# Patient Record
Sex: Male | Born: 1960 | Hispanic: No | Marital: Married | Smoking: Never smoker
Health system: Southern US, Community
[De-identification: ages and names within clinical notes are randomized; demographics above are authoritative.]

---

## 2009-11-19 ENCOUNTER — Ambulatory Visit: Payer: Self-pay | Admitting: Endocrinology

## 2009-11-19 DIAGNOSIS — G622 Polyneuropathy due to other toxic agents: Secondary | ICD-10-CM

## 2009-11-19 DIAGNOSIS — E109 Type 1 diabetes mellitus without complications: Secondary | ICD-10-CM | POA: Insufficient documentation

## 2009-11-19 DIAGNOSIS — F17200 Nicotine dependence, unspecified, uncomplicated: Secondary | ICD-10-CM

## 2009-11-19 DIAGNOSIS — G619 Inflammatory polyneuropathy, unspecified: Secondary | ICD-10-CM | POA: Insufficient documentation

## 2009-11-19 DIAGNOSIS — N289 Disorder of kidney and ureter, unspecified: Secondary | ICD-10-CM | POA: Insufficient documentation

## 2009-11-19 DIAGNOSIS — E113599 Type 2 diabetes mellitus with proliferative diabetic retinopathy without macular edema, unspecified eye: Secondary | ICD-10-CM | POA: Insufficient documentation

## 2009-11-19 DIAGNOSIS — E11359 Type 2 diabetes mellitus with proliferative diabetic retinopathy without macular edema: Secondary | ICD-10-CM

## 2010-05-12 ENCOUNTER — Encounter: Payer: Self-pay | Admitting: Endocrinology

## 2010-05-17 NOTE — Assessment & Plan Note (Signed)
Summary: NEW ENDO/INSULIN PUMP,LOW BLOOD SUGARS/[facility] ACCESS/CD   Vital Signs:  Patient profile:   50 year old male Height:      70 inches (177.80 cm) Weight:      158.13 pounds (71.88 kg) BMI:     22.77 O2 Sat:      98 % on Room air Temp:     97.9 degrees F (36.61 degrees C) oral Pulse rate:   81 / minute BP sitting:   138 / 86  (left arm) Cuff size:   regular  Vitals Entered By: Brenton Grills MA (November 19, 2009 3:41 PM)  O2 Flow:  Room air CC: New Endo/Insulin Pump/Low CBG's/aj   CC:  New Endo/Insulin Pump/Low CBG's/aj.  History of Present Illness: pt states 24 years h/o dm.  it is complicated by retinopathy, renal dz, and peripheral neuropathy.  he has been on insulin since dx.  he takes novolog via the pump.  no cbg record, but states cbg's are extremely variable (hypoglycemia few x/week, and also frequently over 300).  wife says he does not have good hypoglycemia awareness.  he last had severe hypoglycemia a few weeks ago.    he has had the [employer] ir-2020 insulin pump x 2 years.   he does not know pump settings, and he does not know how to change settings.   pt says his diet and exercise are "fair."  symptomatically, pt states few years of moderate pain at the hands and feet, and associated numbness.   Current Medications (verified): 1)  Gabapentin 300 Mg Caps (Gabapentin) .Marland Kitchen.. 1 Capsule Three Times A Day 2)  Cozaar 50 Mg Tabs (Losartan Potassium) .Marland Kitchen.. 1 Tablet Once Daily 3)  Novolog 100 Unit/ml Soln (Insulin Aspart) .... All Day As Needed 4)  Aspirin 81 Mg Tbec (Aspirin) .Marland Kitchen.. 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 Family History  Diabetes--mother Family History Hypertension  Social History: Married Never Smoked disabled.   Smoking Status:  never Seat Belt Use:  yes  Review of Systems       The patient complains of headaches.         denies weight change, chest pain, sob, n/v, memory loss, depression, easy bruising, and rhinorrhea.  he has leg cramps, excessive nocturnal diaphoresis, erectile dysfunction, and frequent urination.  no change in chronic blurry vision.    Physical Exam  General:  normal appearance.   Head:  head: no deformity eyes: no periorbital swelling, no proptosis external nose and ears are normal mouth: no lesion seen Neck:  Supple without thyroid enlargement or tenderness.  Lungs:  Clear to auscultation bilaterally. Normal respiratory effort.  Heart:  Regular rate and rhythm without murmurs or gallops noted. Normal S1,S2.   Abdomen:  abdomen is soft, nontender.  no hepatosplenomegaly.   not distended.  no hernia  Msk:  muscle bulk and strength are grossly normal.  no obvious joint swelling.  gait is normal and steady Pulses:  dorsalis pedis intact bilat.  no carotid bruit Extremities:  no deformity.  no ulcer on the feet.  feet are of normal color and temp.  no edema mycotic toenails.   Neurologic:  cn 2-12 grossly intact.   readily moves all 4's.   sensation is intact to touch on the  feet, but decreased from normal Skin:  seborrhea on the face Cervical Nodes:  No significant adenopathy.  Psych:  Alert and cooperative; normal mood and affect; normal attention span and concentration.     Impression & Recommendations:  Problem # 1:  IDDM (ICD-250.01) glucagon is refused  Problem # 2:  POLYNEUROPATHY (ICD-357.9) prob due to #1  Problem # 3:  RENAL DISEASE (ICD-593.9) due to #1  Medications Added to Medication List This Visit: 1)  Gabapentin 300 Mg Caps (Gabapentin) .Marland Kitchen.. 1 capsule three times a day 2)  Cozaar 50 Mg Tabs (Losartan potassium) .Marland Kitchen.. 1 tablet once daily 3)   Novolog 100 Unit/ml Soln (Insulin aspart) .... Via pump, total approx 40 units/day 4)  Aspirin 81 Mg Tbec (Aspirin) .Marland Kitchen.. 1 once daily  Other Orders: New Patient Level IV (58527)  Patient Instructions: 1)  good diet and exercise habits significanly improve the control of your diabetes.  please let me know if you wish to be referred to a dietician.  high blood sugar is very risky to your health.  you should see an eye doctor every year. 2)  controlling your blood pressure and cholesterol drastically reduces the damage diabetes does to your body.  this also applies to quitting smoking.  please discuss these with your doctor.  you should take an aspirin every day, unless you have been advised by a doctor not to. 3)  check your blood sugar 4 times a day--before the 3 meals, and at bedtime.  also check if you have symptoms of your blood sugar being too high or too low.  please keep a record of the readings and bring it to your next appointment here.  please call us sooner if you are having low blood sugar episodes. 4)  you should obtain a "medic-alert" membership.  here is a form. 5)  please call with pump settings. 6)  Please schedule a follow-up appointment in 1 month.

## 2010-06-07 ENCOUNTER — Ambulatory Visit: Payer: Self-pay | Admitting: Endocrinology

## 2010-06-14 NOTE — Letter (Signed)
Summary: [facility]  [facility]   Imported By: Sherian Rein 06/08/2010 09:23:37  _____________________________________________________________________  External Attachment:    Type:   Image     Comment:   External Document

## 2017-08-22 ENCOUNTER — Emergency Department (HOSPITAL_COMMUNITY): Payer: Medicaid Other

## 2017-08-22 ENCOUNTER — Emergency Department (HOSPITAL_COMMUNITY)
Admission: EM | Admit: 2017-08-22 | Discharge: 2017-08-22 | Disposition: A | Discharge status: Home / Self Care | Payer: Medicaid Other | Attending: Emergency Medicine | Admitting: Emergency Medicine

## 2017-08-22 DIAGNOSIS — S3991XA Unspecified injury of abdomen, initial encounter: Secondary | ICD-10-CM | POA: Diagnosis not present

## 2017-08-22 DIAGNOSIS — S0990XA Unspecified injury of head, initial encounter: Secondary | ICD-10-CM | POA: Insufficient documentation

## 2017-08-22 DIAGNOSIS — S299XXA Unspecified injury of thorax, initial encounter: Secondary | ICD-10-CM | POA: Insufficient documentation

## 2017-08-22 DIAGNOSIS — Z7982 Long term (current) use of aspirin: Secondary | ICD-10-CM | POA: Insufficient documentation

## 2017-08-22 DIAGNOSIS — Y999 Unspecified external cause status: Secondary | ICD-10-CM | POA: Diagnosis not present

## 2017-08-22 DIAGNOSIS — S0181XA Laceration without foreign body of other part of head, initial encounter: Secondary | ICD-10-CM

## 2017-08-22 DIAGNOSIS — Y929 Unspecified place or not applicable: Secondary | ICD-10-CM | POA: Diagnosis not present

## 2017-08-22 DIAGNOSIS — M795 Residual foreign body in soft tissue: Secondary | ICD-10-CM

## 2017-08-22 DIAGNOSIS — Z79899 Other long term (current) drug therapy: Secondary | ICD-10-CM | POA: Diagnosis not present

## 2017-08-22 DIAGNOSIS — Z794 Long term (current) use of insulin: Secondary | ICD-10-CM | POA: Insufficient documentation

## 2017-08-22 DIAGNOSIS — R410 Disorientation, unspecified: Secondary | ICD-10-CM | POA: Diagnosis present

## 2017-08-22 DIAGNOSIS — Z23 Encounter for immunization: Secondary | ICD-10-CM | POA: Diagnosis not present

## 2017-08-22 DIAGNOSIS — E162 Hypoglycemia, unspecified: Secondary | ICD-10-CM | POA: Diagnosis not present

## 2017-08-22 DIAGNOSIS — Y939 Activity, unspecified: Secondary | ICD-10-CM | POA: Insufficient documentation

## 2017-08-22 LAB — I-STAT CHEM 8, ED
BUN: 30 mg/dL — AB (ref 6–20)
CALCIUM ION: 1.13 mmol/L — AB (ref 1.15–1.40)
CHLORIDE: 99 mmol/L — AB (ref 101–111)
CREATININE: 1.2 mg/dL (ref 0.61–1.24)
Glucose, Bld: 197 mg/dL — ABNORMAL HIGH (ref 65–99)
HCT: 35 % — ABNORMAL LOW (ref 39.0–52.0)
Hemoglobin: 11.9 g/dL — ABNORMAL LOW (ref 13.0–17.0)
Potassium: 3.9 mmol/L (ref 3.5–5.1)
SODIUM: 138 mmol/L (ref 135–145)
TCO2: 28 mmol/L (ref 22–32)

## 2017-08-22 LAB — COMPREHENSIVE METABOLIC PANEL
ALBUMIN: 3.3 g/dL — AB (ref 3.5–5.0)
ALK PHOS: 106 U/L (ref 38–126)
ALT: 25 U/L (ref 17–63)
ANION GAP: 7 (ref 5–15)
AST: 27 U/L (ref 15–41)
BILIRUBIN TOTAL: 1 mg/dL (ref 0.3–1.2)
BUN: 25 mg/dL — AB (ref 6–20)
CALCIUM: 8.6 mg/dL — AB (ref 8.9–10.3)
CO2: 27 mmol/L (ref 22–32)
Chloride: 104 mmol/L (ref 101–111)
Creatinine, Ser: 1.32 mg/dL — ABNORMAL HIGH (ref 0.61–1.24)
GFR calc Af Amer: >60 mL/min (ref >60)
GFR, EST NON AFRICAN AMERICAN: 60 mL/min — AB (ref >60)
GLUCOSE: 202 mg/dL — AB (ref 65–99)
Potassium: 4 mmol/L (ref 3.5–5.1)
Sodium: 138 mmol/L (ref 135–145)
TOTAL PROTEIN: 5.4 g/dL — AB (ref 6.5–8.1)

## 2017-08-22 LAB — SAMPLE TO BLOOD BANK

## 2017-08-22 LAB — CBC
HCT: 36.3 % — ABNORMAL LOW (ref 39.0–52.0)
Hemoglobin: 12.6 g/dL — ABNORMAL LOW (ref 13.0–17.0)
MCH: 29.8 pg (ref 26.0–34.0)
MCHC: 34.7 g/dL (ref 30.0–36.0)
MCV: 85.8 fL (ref 78.0–100.0)
Platelets: 179 10*3/uL (ref 150–400)
RBC: 4.23 MIL/uL (ref 4.22–5.81)
RDW: 12.4 % (ref 11.5–15.5)
WBC: 6 10*3/uL (ref 4.0–10.5)

## 2017-08-22 LAB — PROTIME-INR
INR: 1.05
Prothrombin Time: 13.6 seconds (ref 11.4–15.2)

## 2017-08-22 LAB — CBG MONITORING, ED
GLUCOSE-CAPILLARY: 342 mg/dL — AB (ref 65–99)
Glucose-Capillary: 57 mg/dL — ABNORMAL LOW (ref 65–99)
Glucose-Capillary: 149 mg/dL — ABNORMAL HIGH (ref 65–99)

## 2017-08-22 LAB — ETHANOL

## 2017-08-22 LAB — I-STAT CG4 LACTIC ACID, ED: LACTIC ACID, VENOUS: 1.51 mmol/L (ref 0.5–1.9)

## 2017-08-22 MED ORDER — IOHEXOL 300 MG/ML  SOLN
100.0000 mL | Freq: Once | INTRAMUSCULAR | Status: AC | PRN
  Administered 2017-08-22: 100 mL via INTRAVENOUS

## 2017-08-22 MED ORDER — TETANUS-DIPHTH-ACELL PERTUSSIS 5-2.5-18.5 LF-MCG/0.5 IM SUSP
0.5000 mL | Freq: Once | INTRAMUSCULAR | Status: AC
  Administered 2017-08-22: 0.5 mL via INTRAMUSCULAR
  Filled 2017-08-22: qty 0.5

## 2017-08-22 MED ORDER — DEXTROSE-NACL 5-0.9 % IV SOLN: INTRAVENOUS | Status: DC

## 2017-08-22 MED ORDER — ONDANSETRON HCL 4 MG/2ML IJ SOLN
INTRAMUSCULAR | Status: AC
  Filled 2017-08-22: qty 2

## 2017-08-22 MED ORDER — DEXTROSE 50 % IV SOLN
INTRAVENOUS | Status: AC
  Filled 2017-08-22: qty 50

## 2017-08-22 MED ORDER — DEXTROSE 50 % IV SOLN
INTRAVENOUS | Status: AC | PRN
  Administered 2017-08-22: 1 via INTRAVENOUS

## 2017-08-22 MED ORDER — HYDROCODONE-ACETAMINOPHEN 5-325 MG PO TABS: 1.0000 | ORAL_TABLET | Freq: Four times a day (QID) | ORAL | 0 refills | Status: AC | PRN

## 2017-08-22 MED ORDER — LIDOCAINE-EPINEPHRINE (PF) 2 %-1:200000 IJ SOLN
10.0000 mL | Freq: Once | INTRAMUSCULAR | Status: AC
  Administered 2017-08-22: 10 mL via INTRADERMAL
  Filled 2017-08-22: qty 20

## 2017-08-22 NOTE — ED Notes (Signed)
Report given to Suzy Bouchard
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
## 2017-08-22 NOTE — ED Notes (Signed)
Pt informed we need to a urine specimen; pt verbalized understanding.
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
## 2017-08-22 NOTE — ED Notes (Signed)
PA at bedside to have left arm lac repaired.

## 2017-08-22 NOTE — ED Notes (Signed)
Pt given [place] sandwich, apple sauce, and diet coke.  Family remains at bedside.  Awaiting disposition.

## 2017-08-22 NOTE — ED Notes (Signed)
Pt on phone with friend. 

## 2017-08-22 NOTE — ED Provider Notes (Signed)
2:12 PM Per request of DR. Deretha Emory, I have irrigate the lac on pt's L elbow.  There's a retained shard of glass in the wound.  I believe it was removed during irrigation as I am unable to appreciate it.  Will repeat L elbow xray to confirm.  LACERATION REPAIR Performed by: Fayrene Helper Authorized by: Fayrene Helper Consent: Verbal consent obtained. Risks and benefits: risks, benefits and alternatives were discussed Consent given by: patient Patient identity confirmed: provided demographic data Prepped and Draped in normal sterile fashion Wound explored  Laceration Location: L elbow  Laceration Length: 0.5cm  Foreign Bodies palpated  Anesthesia: local infiltration  Local anesthetic: lidocaine 2% 1 epinephrine  Anesthetic total: 2 ml  Irrigation method: syringe Amount of cleaning: standard  Skin closure: prolene 5.0  Number of sutures: 1  Technique: simple interrupted.  Patient tolerance: Patient tolerated the procedure well with no immediate complications.    Fayrene Helper, PA-C 08/22/17 1613    Vanetta Mulders, MD 08/23/17 [phone]

## 2017-08-22 NOTE — ED Notes (Addendum)
Report from [place], [place].  Pt alert.  Family at bedside. Awaiting x-ray.

## 2017-08-22 NOTE — Discharge Instructions (Signed)
Expect to be sore and stiff the next several days.  Work note provided to be out of work for 5 days.  Rest as much as possible.  Take the hydrocodone as needed.  Follow-up with your primary care doctor.  CT scans of head neck chest abdomen pelvis without any acute findings.

## 2017-08-22 NOTE — ED Notes (Addendum)
Called radiology regarding repeat x-ray of elbow that has not been completed.  Pt requesting something to eat/drink.  Informed him we would have to wait for x-ray results.

## 2017-08-22 NOTE — ED Notes (Signed)
Pt CBG 57, RN notified.
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
## 2017-08-23 LAB — CDS SEROLOGY

## 2020-09-06 ENCOUNTER — Other Ambulatory Visit: Payer: Self-pay

## 2020-09-06 ENCOUNTER — Encounter (HOSPITAL_COMMUNITY): Payer: Self-pay

## 2020-09-06 ENCOUNTER — Inpatient Hospital Stay (HOSPITAL_COMMUNITY)
Admission: EM | Admit: 2020-09-06 | Discharge: 2020-09-09 | DRG: 208 | Disposition: A | Discharge status: Home / Self Care | Payer: Medicaid Other | Attending: Internal Medicine | Admitting: Internal Medicine

## 2020-09-06 ENCOUNTER — Emergency Department (HOSPITAL_COMMUNITY): Payer: Medicaid Other

## 2020-09-06 DIAGNOSIS — E860 Dehydration: Secondary | ICD-10-CM | POA: Diagnosis present

## 2020-09-06 DIAGNOSIS — E785 Hyperlipidemia, unspecified: Secondary | ICD-10-CM | POA: Diagnosis present

## 2020-09-06 DIAGNOSIS — G9341 Metabolic encephalopathy: Secondary | ICD-10-CM | POA: Diagnosis present

## 2020-09-06 DIAGNOSIS — N182 Chronic kidney disease, stage 2 (mild): Secondary | ICD-10-CM | POA: Diagnosis present

## 2020-09-06 DIAGNOSIS — E1011 Type 1 diabetes mellitus with ketoacidosis with coma: Secondary | ICD-10-CM

## 2020-09-06 DIAGNOSIS — N179 Acute kidney failure, unspecified: Secondary | ICD-10-CM | POA: Diagnosis present

## 2020-09-06 DIAGNOSIS — Z7902 Long term (current) use of antithrombotics/antiplatelets: Secondary | ICD-10-CM

## 2020-09-06 DIAGNOSIS — E1022 Type 1 diabetes mellitus with diabetic chronic kidney disease: Secondary | ICD-10-CM | POA: Diagnosis present

## 2020-09-06 DIAGNOSIS — Z8673 Personal history of transient ischemic attack (TIA), and cerebral infarction without residual deficits: Secondary | ICD-10-CM

## 2020-09-06 DIAGNOSIS — K219 Gastro-esophageal reflux disease without esophagitis: Secondary | ICD-10-CM | POA: Diagnosis present

## 2020-09-06 DIAGNOSIS — I129 Hypertensive chronic kidney disease with stage 1 through stage 4 chronic kidney disease, or unspecified chronic kidney disease: Secondary | ICD-10-CM | POA: Diagnosis present

## 2020-09-06 DIAGNOSIS — Z4659 Encounter for fitting and adjustment of other gastrointestinal appliance and device: Secondary | ICD-10-CM

## 2020-09-06 DIAGNOSIS — E1010 Type 1 diabetes mellitus with ketoacidosis without coma: Secondary | ICD-10-CM | POA: Diagnosis present

## 2020-09-06 DIAGNOSIS — J9601 Acute respiratory failure with hypoxia: Secondary | ICD-10-CM | POA: Diagnosis present

## 2020-09-06 DIAGNOSIS — Z79899 Other long term (current) drug therapy: Secondary | ICD-10-CM

## 2020-09-06 DIAGNOSIS — E874 Mixed disorder of acid-base balance: Secondary | ICD-10-CM | POA: Diagnosis present

## 2020-09-06 DIAGNOSIS — Z7982 Long term (current) use of aspirin: Secondary | ICD-10-CM

## 2020-09-06 DIAGNOSIS — R339 Retention of urine, unspecified: Secondary | ICD-10-CM | POA: Diagnosis not present

## 2020-09-06 DIAGNOSIS — U071 COVID-19: Principal | ICD-10-CM | POA: Diagnosis present

## 2020-09-06 DIAGNOSIS — Z794 Long term (current) use of insulin: Secondary | ICD-10-CM | POA: Diagnosis not present

## 2020-09-06 DIAGNOSIS — Z9641 Presence of insulin pump (external) (internal): Secondary | ICD-10-CM | POA: Diagnosis present

## 2020-09-06 DIAGNOSIS — E1110 Type 2 diabetes mellitus with ketoacidosis without coma: Secondary | ICD-10-CM | POA: Diagnosis present

## 2020-09-06 LAB — I-STAT VENOUS BLOOD GAS, ED
Acid-base deficit: 24 mmol/L — ABNORMAL HIGH (ref 0.0–2.0)
Bicarbonate: 6.2 mmol/L — ABNORMAL LOW (ref 20.0–28.0)
Calcium, Ion: 1.02 mmol/L — ABNORMAL LOW (ref 1.15–1.40)
HCT: 36 % — ABNORMAL LOW (ref 39.0–52.0)
Hemoglobin: 12.2 g/dL — ABNORMAL LOW (ref 13.0–17.0)
O2 Saturation: 83 %
Potassium: 7.2 mmol/L (ref 3.5–5.1)
Sodium: 128 mmol/L — ABNORMAL LOW (ref 135–145)
TCO2: 7 mmol/L — ABNORMAL LOW (ref 22–32)
pCO2, Ven: 24.7 mmHg — ABNORMAL LOW (ref 44.0–60.0)
pH, Ven: 7.008 — CL (ref 7.250–7.430)
pO2, Ven: 69 mmHg — ABNORMAL HIGH (ref 32.0–45.0)

## 2020-09-06 LAB — I-STAT ARTERIAL BLOOD GAS, ED
Acid-base deficit: 20 mmol/L — ABNORMAL HIGH (ref 0.0–2.0)
Bicarbonate: 7.8 mmol/L — ABNORMAL LOW (ref 20.0–28.0)
Calcium, Ion: 1.16 mmol/L (ref 1.15–1.40)
HCT: 32 % — ABNORMAL LOW (ref 39.0–52.0)
Hemoglobin: 10.9 g/dL — ABNORMAL LOW (ref 13.0–17.0)
O2 Saturation: 100 %
Patient temperature: 97.6
Potassium: 6.3 mmol/L (ref 3.5–5.1)
Sodium: 128 mmol/L — ABNORMAL LOW (ref 135–145)
TCO2: 9 mmol/L — ABNORMAL LOW (ref 22–32)
pCO2 arterial: 24.5 mmHg — ABNORMAL LOW (ref 32.0–48.0)
pH, Arterial: 7.108 — CL (ref 7.350–7.450)
pO2, Arterial: 387 mmHg — ABNORMAL HIGH (ref 83.0–108.0)

## 2020-09-06 LAB — CBC
HCT: 38.5 % — ABNORMAL LOW (ref 39.0–52.0)
HCT: 40.7 % (ref 39.0–52.0)
Hemoglobin: 12.1 g/dL — ABNORMAL LOW (ref 13.0–17.0)
Hemoglobin: 11.7 g/dL — ABNORMAL LOW (ref 13.0–17.0)
MCH: 30.9 pg (ref 26.0–34.0)
MCH: 30.2 pg (ref 26.0–34.0)
MCHC: 31.4 g/dL (ref 30.0–36.0)
MCHC: 28.7 g/dL — ABNORMAL LOW (ref 30.0–36.0)
MCV: 98.5 fL (ref 80.0–100.0)
MCV: 105.2 fL — ABNORMAL HIGH (ref 80.0–100.0)
Platelets: 241 10*3/uL (ref 150–400)
Platelets: 196 10*3/uL (ref 150–400)
RBC: 3.91 MIL/uL — ABNORMAL LOW (ref 4.22–5.81)
RBC: 3.87 MIL/uL — ABNORMAL LOW (ref 4.22–5.81)
RDW: 12.4 % (ref 11.5–15.5)
RDW: 12.2 % (ref 11.5–15.5)
WBC: 22.5 10*3/uL — ABNORMAL HIGH (ref 4.0–10.5)
WBC: 17.9 10*3/uL — ABNORMAL HIGH (ref 4.0–10.5)
nRBC: 0 % (ref 0.0–0.2)
nRBC: 0 % (ref 0.0–0.2)

## 2020-09-06 LAB — BASIC METABOLIC PANEL
Anion gap: 28 — ABNORMAL HIGH (ref 5–15)
BUN: 67 mg/dL — ABNORMAL HIGH (ref 6–20)
CO2: 7 mmol/L — ABNORMAL LOW (ref 22–32)
Calcium: 7.9 mg/dL — ABNORMAL LOW (ref 8.9–10.3)
Chloride: 92 mmol/L — ABNORMAL LOW (ref 98–111)
Creatinine, Ser: 4.15 mg/dL — ABNORMAL HIGH (ref 0.61–1.24)
GFR, Estimated: 16 mL/min — ABNORMAL LOW (ref >60)
Glucose, Bld: 1218 mg/dL (ref 70–99)
Potassium: >7.5 mmol/L (ref 3.5–5.1)
Sodium: 127 mmol/L — ABNORMAL LOW (ref 135–145)

## 2020-09-06 LAB — CBG MONITORING, ED
Glucose-Capillary: >600 mg/dL (ref 70–99)
Glucose-Capillary: >600 mg/dL (ref 70–99)
Glucose-Capillary: >600 mg/dL (ref 70–99)
Glucose-Capillary: >600 mg/dL (ref 70–99)
Glucose-Capillary: >600 mg/dL (ref 70–99)

## 2020-09-06 LAB — GLUCOSE, CAPILLARY: Glucose-Capillary: >600 mg/dL (ref 70–99)

## 2020-09-06 MED ORDER — ROCURONIUM BROMIDE 50 MG/5ML IV SOLN
INTRAVENOUS | Status: DC | PRN
  Administered 2020-09-06: 90 mg via INTRAVENOUS

## 2020-09-06 MED ORDER — DEXTROSE 50 % IV SOLN
0.0000 mL | INTRAVENOUS | Status: DC | PRN
Start: 2020-09-06 — End: 2020-09-09

## 2020-09-06 MED ORDER — FENTANYL BOLUS VIA INFUSION
50.0000 ug | INTRAVENOUS | Status: DC | PRN
  Administered 2020-09-07: 50 ug via INTRAVENOUS
  Filled 2020-09-06: qty 100

## 2020-09-06 MED ORDER — CALCIUM CHLORIDE 10 % IV SOLN
INTRAVENOUS | Status: DC | PRN
  Administered 2020-09-06: 1 g via INTRAVENOUS

## 2020-09-06 MED ORDER — FENTANYL 2500MCG IN NS 250ML (10MCG/ML) PREMIX INFUSION
50.0000 ug/h | INTRAVENOUS | Status: DC
Start: 2020-09-06 — End: 2020-09-07
  Administered 2020-09-06: 100 ug/h via INTRAVENOUS
  Administered 2020-09-06: 50 ug/h via INTRAVENOUS
  Filled 2020-09-06: qty 250

## 2020-09-06 MED ORDER — DEXTROSE IN LACTATED RINGERS 5 % IV SOLN: INTRAVENOUS | Status: DC

## 2020-09-06 MED ORDER — ETOMIDATE 2 MG/ML IV SOLN
INTRAVENOUS | Status: AC
  Filled 2020-09-06: qty 20

## 2020-09-06 MED ORDER — LACTATED RINGERS IV SOLN: INTRAVENOUS | Status: DC

## 2020-09-06 MED ORDER — LACTATED RINGERS IV BOLUS
2000.0000 mL | Freq: Once | INTRAVENOUS | Status: AC
  Administered 2020-09-06: 2000 mL via INTRAVENOUS

## 2020-09-06 MED ORDER — ROCURONIUM BROMIDE 10 MG/ML (PF) SYRINGE
PREFILLED_SYRINGE | INTRAVENOUS | Status: AC
  Filled 2020-09-06: qty 10

## 2020-09-06 MED ORDER — EPINEPHRINE HCL 5 MG/250ML IV SOLN IN NS
INTRAVENOUS | Status: AC
  Filled 2020-09-06: qty 250

## 2020-09-06 MED ORDER — ETOMIDATE 2 MG/ML IV SOLN
INTRAVENOUS | Status: DC | PRN
  Administered 2020-09-06: 20 mg via INTRAVENOUS

## 2020-09-06 MED ORDER — HEPARIN SODIUM (PORCINE) 5000 UNIT/ML IJ SOLN
5000.0000 [IU] | Freq: Three times a day (TID) | INTRAMUSCULAR | Status: DC
  Administered 2020-09-07 – 2020-09-09 (×7): 5000 [IU] via SUBCUTANEOUS
  Filled 2020-09-06 (×8): qty 1

## 2020-09-06 MED ORDER — EPINEPHRINE 1 MG/10ML IJ SOSY
PREFILLED_SYRINGE | INTRAMUSCULAR | Status: AC
  Filled 2020-09-06: qty 10

## 2020-09-06 MED ORDER — SUCCINYLCHOLINE CHLORIDE 200 MG/10ML IV SOSY
PREFILLED_SYRINGE | INTRAVENOUS | Status: AC
  Filled 2020-09-06: qty 10

## 2020-09-06 MED ORDER — STERILE WATER FOR INJECTION IV SOLN
Freq: Once | INTRAVENOUS | Status: AC
  Filled 2020-09-06: qty 1000

## 2020-09-06 MED ORDER — INSULIN ASPART 100 UNIT/ML IV SOLN
10.0000 [IU] | Freq: Once | INTRAVENOUS | Status: AC
  Administered 2020-09-06: 10 [IU] via INTRAVENOUS

## 2020-09-06 MED ORDER — CALCIUM GLUCONATE-NACL 1-0.675 GM/50ML-% IV SOLN: 1.0000 g | Freq: Once | INTRAVENOUS | Status: DC

## 2020-09-06 MED ORDER — INSULIN REGULAR(HUMAN) IN NACL 100-0.9 UT/100ML-% IV SOLN
INTRAVENOUS | Status: DC
  Administered 2020-09-06: 6.5 [IU]/h via INTRAVENOUS
  Filled 2020-09-06 (×2): qty 100

## 2020-09-06 MED ORDER — SODIUM BICARBONATE 8.4 % IV SOLN
INTRAVENOUS | Status: AC
  Filled 2020-09-06: qty 50

## 2020-09-06 MED ORDER — CALCIUM GLUCONATE-NACL 1-0.675 GM/50ML-% IV SOLN
INTRAVENOUS | Status: AC
  Filled 2020-09-06: qty 50

## 2020-09-06 NOTE — ED Triage Notes (Signed)
BIB EMS for high blood sugar since last night. Pt came with an insulin pump. Initially pt was 83% on room air with EMS. Pt c/o weakness, abdominal pain. Pt was given 1800 IVF.

## 2020-09-06 NOTE — ED Provider Notes (Signed)
I saw and evaluated the patient, reviewed the resident's note and I agree with the findings and plan.  EKG Interpretation  Date/Time:  Monday Sep 06 2020 18:50:58 EDT Ventricular Rate:  102 PR Interval:  169 QRS Duration: 122 QT Interval:  374 QTC Calculation: 488 R Axis:   115 Text Interpretation: Sinus tachycardia IVCD, consider atypical RBBB Probable anteroseptal infarct, old Confirmed by Lorre Nick ([id number]) on 09/06/2020 8:29:32 PM  60 year old male with history of type 1 diabetes presents with altered status.  Patient has evidence of DKA here.  Given copious months of IV fluids.  Will start insulin drip after his electrolytes resolved.  He will require likely ICU admission   Lorre Nick, MD 09/06/20 2004

## 2020-09-06 NOTE — Progress Notes (Signed)
Date and time results received: 09/06/20 2330 (use smartphrase ".now" to insert current time)  Test: K+ Critical Value: >7.5  Name of Provider Notified: Pola Corn  Orders Received? Or Actions Taken?: Actions Taken: Awaiting orders

## 2020-09-06 NOTE — ED Provider Notes (Signed)
[facility] EMERGENCY DEPARTMENT Provider Note   CSN: [id number] Arrival date & time: 09/06/20  1844     History Chief Complaint  Patient presents with  . Hyperglycemia  . Manic Behavior  . Weakness    CHAPMAN MATTEUCCI is a 60 y.o. male.  HPI  Presents with altered mental status.  Significantly altered on initial evaluation, occasionally speaking clear words nonsensically.  Not following commands.  Per nursing report, brought in by EMS for high blood sugar.  Eventually wife came to bedside and reported that his blood sugar went up last night and he started throwing up and threw up a lot.  He was hyperglycemic at home.  She last saw him before work this morning.  1800 cc IV fluids given by EMS.  Level 5 caveat applies.    No past medical history on file.  Patient Active Problem List   Diagnosis Date Noted  . DKA (diabetic ketoacidosis) ([place]) 09/06/2020  . IDDM 11/19/2009  . SMOKELESS TOBACCO ABUSE 11/19/2009  . POLYNEUROPATHY 11/19/2009  . 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 RENAL DISEASE 11/19/2009         No family history on file.  Social History   Tobacco Use  . Smoking status: Never Smoker  . Smokeless tobacco: Never Used  Substance Use Topics  . Alcohol use: Not Currently    Home Medications Prior to Admission medications   Medication Sig Start Date End Date Taking? Authorizing Provider  amLODipine (NORVASC) 10 MG tablet Take 10 mg by mouth daily.    [provider]  aspirin EC 325 MG tablet Take 325 mg by mouth daily.    [provider]  atorvastatin (LIPITOR) 40 MG tablet Take 40 mg by mouth daily. 06/07/20   [provider]  carvedilol (COREG) 6.25 MG tablet Take 6.25 mg by mouth 2 (two) times daily. 07/20/20   [provider]  cloNIDine (CATAPRES) 0.3 MG tablet Take 0.3 mg by mouth 3 (three) times daily.    [provider]  clopidogrel (PLAVIX) 75 MG tablet Take 75 mg by mouth  daily. 08/30/20   [provider]  gabapentin (NEURONTIN) 300 MG capsule Take 300 mg by mouth 3 (three) times daily. 08/30/20   [provider]  HYDROcodone-acetaminophen (NORCO/VICODIN) 5-325 MG tablet Take 1-2 tablets by mouth every 6 (six) hours as needed for moderate pain. 08/22/17   Fredia Sorrow, MD  hydroxypropyl methylcellulose / hypromellose (ISOPTO TEARS / GONIOVISC) 2.5 % ophthalmic solution Place 1 drop into both eyes as needed for dry eyes.    [provider]  hydrOXYzine (ATARAX/VISTARIL) 25 MG tablet Take 25 mg by mouth daily. 08/12/20   [provider]  insulin aspart (NOVOLOG) 100 UNIT/ML injection Inject into the skin See admin instructions. Uses pump    [provider]  Insulin Human (INSULIN PUMP) SOLN Inject into the skin See admin instructions. using Novolog 100 unit/ml  via insulin pump    [provider]  losartan (COZAAR) 100 MG tablet Take 100 mg by mouth daily. 08/30/20   [provider]  neomycin-bacitracin-polymyxin (NEOSPORIN) ointment Apply 1 application topically as needed for wound care.    [provider]  pantoprazole (PROTONIX) 40 MG tablet Take 40 mg by mouth daily. 08/30/20   [provider]  prednisoLONE acetate (PRED FORTE) 1 % ophthalmic suspension Place 1 drop into both eyes 4 (four) times daily. 08/30/20   [provider]  sevelamer (RENAGEL) 800 MG tablet Take by  mouth 3 (three) times daily with meals. Take 2 tablets (1,600) mg with every meal and one tablet (817m) with a snack.    [provider]    Allergies    Patient has no known allergies.  Review of Systems   Review of Systems  Unable to perform ROS: Mental status change    Physical Exam Updated Vital Signs BP 127/63   Pulse 91   Temp (!) 97.5 F (36.4 C) (Oral)   Resp (!) 30   Ht _0  (1.778 m)   Wt 76.2 kg   SpO2 99%   BMI 24.11 kg/m   Physical Exam Vitals and nursing note reviewed.   Constitutional:      General: He is in acute distress.     Appearance: He is well-developed. He is ill-appearing.  HENT:     Head: Normocephalic and atraumatic.     Mouth/Throat:     Mouth: Mucous membranes are dry.     Pharynx: Oropharynx is clear.  Eyes:     Conjunctiva/sclera: Conjunctivae normal.     Pupils: Pupils are equal, round, and reactive to light.  Cardiovascular:     Rate and Rhythm: Normal rate and regular rhythm.     Heart sounds: No murmur heard.   Pulmonary:     Breath sounds: Normal breath sounds.     Comments: Kussmaul respirations Abdominal:     Palpations: Abdomen is soft.     Tenderness: There is no abdominal tenderness.  Musculoskeletal:     Cervical back: Neck supple.     Right lower leg: No edema.     Left lower leg: No edema.  Skin:    General: Skin is warm and dry.  Neurological:     Comments: GCS [id number] initially GCS 3 on repeat exam  Psychiatric:        Behavior: Behavior normal.        Thought Content: Thought content normal.     ED Results / Procedures / Treatments   Labs (all labs ordered are listed, but only abnormal results are displayed) Labs Reviewed  BASIC METABOLIC PANEL - Abnormal; Notable for the following components:      Result Value   Sodium 127 (*)    Potassium >7.5 (*)    Chloride 92 (*)    CO2 7 (*)    Glucose, Bld 1,218 (*)    BUN 67 (*)    Creatinine, Ser 4.15 (*)    Calcium 7.9 (*)    GFR, Estimated 16 (*)    Anion gap 28 (*)    All other components within normal limits  CBC - Abnormal; Notable for the following components:   WBC 22.5 (*)    RBC 3.91 (*)    Hemoglobin 12.1 (*)    HCT 38.5 (*)    All other components within normal limits  BETA-HYDROXYBUTYRIC ACID - Abnormal; Notable for the following components:   Beta-Hydroxybutyric Acid >8.00 (*)    All other components within normal limits  CBC - Abnormal; Notable for the following components:   WBC 17.9 (*)    RBC 3.87 (*)    Hemoglobin 11.7 (*)     MCV 105.2 (*)    MCHC 28.7 (*)    All other components within normal limits  COMPREHENSIVE METABOLIC PANEL - Abnormal; Notable for the following components:   Sodium 129 (*)    Potassium >7.5 (*)    Chloride 94 (*)    CO2 <7 (*)  Glucose, Bld 1,182 (*)    BUN 69 (*)    Creatinine, Ser 4.24 (*)    Calcium 8.3 (*)    Total Protein 4.8 (*)    Albumin 3.0 (*)    Alkaline Phosphatase 143 (*)    Total Bilirubin 1.4 (*)    GFR, Estimated 16 (*)    All other components within normal limits  GLUCOSE, CAPILLARY - Abnormal; Notable for the following components:   Glucose-Capillary >600 (*)    All other components within normal limits  CBG MONITORING, ED - Abnormal; Notable for the following components:   Glucose-Capillary >600 (*)    All other components within normal limits  CBG MONITORING, ED - Abnormal; Notable for the following components:   Glucose-Capillary >600 (*)    All other components within normal limits  CBG MONITORING, ED - Abnormal; Notable for the following components:   Glucose-Capillary >600 (*)    All other components within normal limits  I-STAT VENOUS BLOOD GAS, ED - Abnormal; Notable for the following components:   pH, Ven 7.008 (*)    pCO2, Ven 24.7 (*)    pO2, Ven 69.0 (*)    Bicarbonate 6.2 (*)    TCO2 7 (*)    Acid-base deficit 24.0 (*)    Sodium 128 (*)    Potassium 7.2 (*)    Calcium, Ion 1.02 (*)    HCT 36.0 (*)    Hemoglobin 12.2 (*)    All other components within normal limits  I-STAT ARTERIAL BLOOD GAS, ED - Abnormal; Notable for the following components:   pH, Arterial 7.108 (*)    pCO2 arterial 24.5 (*)    pO2, Arterial 387 (*)    Bicarbonate 7.8 (*)    TCO2 9 (*)    Acid-base deficit 20.0 (*)    Sodium 128 (*)    Potassium 6.3 (*)    HCT 32.0 (*)    Hemoglobin 10.9 (*)    All other components within normal limits  CBG MONITORING, ED - Abnormal; Notable for the following components:   Glucose-Capillary >600 (*)    All other  components within normal limits  CBG MONITORING, ED - Abnormal; Notable for the following components:   Glucose-Capillary >600 (*)    All other components within normal limits  CULTURE, BLOOD (ROUTINE X 2)  CULTURE, BLOOD (ROUTINE X 2)  SARS CORONAVIRUS 2 (TAT 6-24 HRS)  MRSA PCR SCREENING  URINALYSIS, ROUTINE W REFLEX MICROSCOPIC  BETA-HYDROXYBUTYRIC ACID  BLOOD GAS, ARTERIAL  HIV ANTIBODY (ROUTINE TESTING W REFLEX)  BASIC METABOLIC PANEL  BASIC METABOLIC PANEL  BASIC METABOLIC PANEL  BETA-HYDROXYBUTYRIC ACID  BETA-HYDROXYBUTYRIC ACID  HEMOGLOBIN A1C  URINALYSIS, ROUTINE W REFLEX MICROSCOPIC  BETA-HYDROXYBUTYRIC ACID  BASIC METABOLIC PANEL  BETA-HYDROXYBUTYRIC ACID    EKG EKG Interpretation  Date/Time:  Monday Sep 06 2020 18:50:58 EDT Ventricular Rate:  102 PR Interval:  169 QRS Duration: 122 QT Interval:  374 QTC Calculation: 488 R Axis:   115 Text Interpretation: Sinus tachycardia IVCD, consider atypical RBBB Probable anteroseptal infarct, old Confirmed by Lacretia Leigh ([id number]) on 09/06/2020 8:03:45 PM   Radiology DG Chest Port 1 View  Result Date: 09/06/2020 CLINICAL DATA:  Encounter for intubation.  Shortness of breath. EXAM: PORTABLE CHEST 1 VIEW COMPARISON:  Cxr 08/22/17. FINDINGS: Endotracheal tube terminates 3.5 cm above the carina. The heart size and mediastinal contours are within normal limits. Biapical pleural/pulmonary scarring. No focal consolidation. No pulmonary edema. No pleural effusion. No pneumothorax. No acute osseous abnormality.  IMPRESSION: No active disease. Electronically Signed   By: Iven Finn M.D.   On: 09/06/2020 21:43    Procedures Procedure Name: Intubation Date/Time: 09/06/2020 11:59 PM Performed by: Aris Lot, MD Induction Type: Rapid sequence Laryngoscope Size: Mac and 3 Grade View: Grade I Tube size: 7.5 mm Number of attempts: 1 Placement Confirmation: Positive ETCO2,  CO2 detector and Breath sounds checked- equal and  bilateral Secured at: 28 cm Tube secured with: ETT holder Dental Injury: Teeth and Oropharynx as per pre-operative assessment         Medications Ordered in ED Medications  rocuronium bromide 100 MG/10ML SOSY (has no administration in time range)  succinylcholine (ANECTINE) 200 MG/10ML syringe (has no administration in time range)  etomidate (AMIDATE) 2 MG/ML injection (has no administration in time range)  EPINEPHrine (ADRENALIN) 1 MG/10ML injection (has no administration in time range)  EPINEPHrine NaCl 4-0.9 MG/250ML-% premix infusion (has no administration in time range)  fentaNYL 2539mg in NS 2532m(1071mml) infusion-PREMIX (100 mcg/hr Intravenous New Bag/Given 09/06/20 2152)  fentaNYL (SUBLIMAZE) bolus via infusion 50-100 mcg (has no administration in time range)  heparin injection 5,000 Units (has no administration in time range)  insulin regular, human (MYXREDLIN) 100 units/ 100 mL infusion (6.5 Units/hr Intravenous New Bag/Given 09/06/20 2141)  lactated ringers infusion ( Intravenous New Bag/Given 09/06/20 2235)  dextrose 5 % in lactated ringers infusion (has no administration in time range)  dextrose 50 % solution 0-50 mL (has no administration in time range)  calcium gluconate 1 g/ 50 mL sodium chloride IVPB (has no administration in time range)  etomidate (AMIDATE) injection (20 mg Intravenous Given 09/06/20 2034)  rocuronium (ZEMURON) injection (90 mg Intravenous Given 09/06/20 2034)  calcium chloride injection (1 g Intravenous Given 09/06/20 2030)  lactated ringers bolus 2,000 mL (0 mLs Intravenous Stopped 09/06/20 2117)  sodium bicarbonate 1 mEq/mL injection (  Given 09/06/20 2030)  sodium bicarbonate 150 mEq in sterile water 1,150 mL infusion ( Intravenous Stopped 09/06/20 2259)  insulin aspart (novoLOG) injection 10 Units (10 Units Intravenous Given 09/06/20 2036)    ED Course  I have reviewed the triage vital signs and the nursing notes.  Pertinent labs & imaging  results that were available during my care of the patient were reviewed by me and considered in my medical decision making (see chart for details).    MDM Rules/Calculators/A&P                           Patient presents with altered mental status.  Kussmaul breathing, concern for DKA on arrival, insulin pump noted.  Afebrile; differential sepsis was considered although DKA most likely explains the symptoms.  At time of arrival, the patient had 2 IVs established and began to receive rapid fluid infusion into each IV.  Unfortunately, he had clinical deterioration shortly into his stay and had worsening of his neurologic status.  He became bradycardic for short period to about 45 and frankly hypotensive with blood pressures in the 70s and weak femoral pulses.  He received a gram of calcium chloride and an amp of bicarb and his hemodynamics improved.  His mental status was still poor so he was intubated and the ventilator was set at a high rate.  His work-up showed severe hyperglycemia, severe hyperkalemia, severe acidosis to 7.0.  ICU was immediately consulted for assistance with management.  Bicarbonate drip was initiated.  DKA diagnosed per the patient's acidosis, anion gap, and hyperglycemia.  Likely  also has a component of HHS.  EKG reviewed by me; T waves are prominent although not peaked, sinus rhythm, tachycardia. Discussed with wife at bedside.  Critical care physician came to bedside and evaluated.  Care was handed off to critical care team and the patient was dispositioned to the ICU.  I reviewed his EKG, no focal airspace disease, no cardiomegaly, endotracheal tube in good position.   Final Clinical Impression(s) / ED Diagnoses Final diagnoses:  Encounter for orogastric (OG) tube placement    Rx / DC Orders ED Discharge Orders    None       Aris Lot, MD 09/07/20 Dyann Kief    Lacretia Leigh, MD 09/07/20 1757

## 2020-09-06 NOTE — Progress Notes (Signed)
Patient transported from ED Room 22 to 2M15 with no complications noted.

## 2020-09-06 NOTE — Progress Notes (Signed)
Date and time results received: 09/06/20 2330 (use smartphrase ".now" to insert current time)  Test: Glucose Critical Value: 1182  Name of Provider Notified: Pola Corn  Orders Received? Or Actions Taken?: Actions Taken: Awaiting Orders

## 2020-09-06 NOTE — H&P (Addendum)
-  NAME:  Cory Nash, MRN:  [id number], DOB:  Jan 21, 1961, LOS: 0 ADMISSION DATE:  09/06/2020, CONSULTATION DATE: 09/06/20 REFERRING MD: Dr. Freida Busman, ED, CHIEF COMPLAINT:  Hyperglycemia, N/V, agitation  History of Present Illness:  60 year old man with a history of DM1, here with DKA.  History obtained from his wife Angie at the bedside.   Yesterday his blood sugars were running high in the 400s.   He was also vomiting (non bloody), poor po intake.  Somewhat agitated, refusing to go to hospital.  In the ED he developed worsening altered mental status, worsening tachypnea.   Pertinent  Medical History  DM1 HTN  Significant Hospital Events: Including procedures, antibiotic start and stop dates in addition to other pertinent events   .   Interim History / Subjective:    Objective   Blood pressure (!) 90/48, pulse 75, temperature 97.6 F (36.4 C), temperature source Oral, resp. rate (!) 22, height 5\' 10"  (1.778 m), weight 76.2 kg, SpO2 96 %.        Intake/Output Summary (Last 24 hours) at 09/06/2020 2101 Last data filed at 09/06/2020 1851 Gross per 24 hour  Intake 1800 ml  Output --  Net 1800 ml   Filed Weights   09/06/20 1858  Weight: 76.2 kg    Examination: General: NAD, intubated, recently paralyzed for intubation HENT:NCAt, PERRL Lungs: CTAB Cardiovascular: RRR no mgr Abdomen: NT, ND, NBS Extremities: no edema, no erythema Neuro: Sedated, paralyzed for intubation   Labs/imaging that I havepersonally reviewed  (right click and "Reselect all SmartList Selections" daily)  CBC, BMP, VBG   Resolved Hospital Problem list     Assessment & Plan:  DKA:  Unclear initial cause.  Blood and urine cultures pending.  CXR clear.  Insulin pump at baseline (removed in ED) Continue bicarb for now.  Continue LR,  Insulin Gtt per protocol.   Chem checks, CBG per protocol.  Cont mech ventilation for now. Hyperventilate while PH low.  Reduced FIO2 to 30%. Extubate when mental  status and acidemia improves.  Currently on fentanyl for analgesia.  NPO  AMS, acute encephalopathy - likely 2/2 DKA, acidemia.  Consider CT head if does not improve with improvement in metabolic state.   AKI: monitor UOP.   HTN: hold antiHTN meds for now.     Best practice (right click and "Reselect all SmartList Selections" daily)  Diet:  NPO Pain/Anxiety/Delirium protocol (if indicated): Yes (RASS goal -1) VAP protocol (if indicated): Yes DVT prophylaxis: Subcutaneous Heparin GI prophylaxis: N/a, likely will only be intubated briefly Glucose control:  Insulin gtt Central venous access:  N/A Arterial line:  N/A Foley:  N/A Mobility:  bed rest  PT consulted: N/A Last date of multidisciplinary goals of care discussion []  Code Status:  full code Disposition: ICU  Labs   CBC: Recent Labs  Lab 09/06/20 1922  WBC 22.5*  HGB 12.1*  HCT 38.5*  MCV 98.5  PLT 241    Basic Metabolic Panel: Recent Labs  Lab 09/06/20 1922  NA 127*  K >7.5*  CL 92*  CO2 7*  GLUCOSE 1,218*  BUN 67*  CREATININE 4.15*  CALCIUM 7.9*   GFR: Estimated Creatinine Clearance: 20.3 mL/min (A) (by C-G formula based on SCr of 4.15 mg/dL (H)). Recent Labs  Lab 09/06/20 1922  WBC 22.5*    Liver Function Tests: No results for input(s): AST, ALT, ALKPHOS, BILITOT, PROT, ALBUMIN in the last 168 hours. No results for input(s): LIPASE, AMYLASE in the  last 168 hours. No results for input(s): AMMONIA in the last 168 hours.  ABG    Component Value Date/Time   TCO2 28 08/22/2017 0931     Coagulation Profile: No results for input(s): INR, PROTIME in the last 168 hours.  Cardiac Enzymes: No results for input(s): CKTOTAL, CKMB, CKMBINDEX, TROPONINI in the last 168 hours.  HbA1C: No results found for: HGBA1C  CBG: Recent Labs  Lab 09/06/20 1847 09/06/20 2054  GLUCAP >600* >600*    Review of Systems:   Unable to assess  Past Medical History:  He,  has no past medical history on  file.   Surgical History:     Social History:   reports that he has never smoked. He has never used smokeless tobacco. He reports previous alcohol use.   Family History:  His family history is not on file.   Allergies No Known Allergies   Home Medications  Prior to Admission medications   Medication Sig Start Date End Date Taking? Authorizing Provider  amLODipine (NORVASC) 10 MG tablet Take 10 mg by mouth daily.    [provider]  aspirin EC 325 MG tablet Take 325 mg by mouth daily.    [provider]  cloNIDine (CATAPRES) 0.3 MG tablet Take 0.3 mg by mouth 3 (three) times daily.    [provider]  HYDROcodone-acetaminophen (NORCO/VICODIN) 5-325 MG tablet Take 1-2 tablets by mouth every 6 (six) hours as needed for moderate pain. 08/22/17   Vanetta Mulders, MD  hydroxypropyl methylcellulose / hypromellose (ISOPTO TEARS / GONIOVISC) 2.5 % ophthalmic solution Place 1 drop into both eyes as needed for dry eyes.    [provider]  insulin aspart (NOVOLOG) 100 UNIT/ML injection Inject into the skin See admin instructions. Uses pump    [provider]  Insulin Human (INSULIN PUMP) SOLN Inject into the skin See admin instructions. using Novolog 100 unit/ml  via insulin pump    [provider]  labetalol (NORMODYNE) 200 MG tablet Take 200 mg by mouth 2 (two) times daily.    [provider]  neomycin-bacitracin-polymyxin (NEOSPORIN) ointment Apply 1 application topically as needed for wound care.    [provider]  sevelamer (RENAGEL) 800 MG tablet Take by mouth 3 (three) times daily with meals. Take 2 tablets (1,600) mg with every meal and one tablet (800mg ) with a snack.    [provider]     Critical care time: 45 minutes

## 2020-09-07 ENCOUNTER — Inpatient Hospital Stay (HOSPITAL_COMMUNITY): Payer: Medicaid Other

## 2020-09-07 ENCOUNTER — Encounter (HOSPITAL_COMMUNITY): Payer: Self-pay | Admitting: Pulmonary Disease

## 2020-09-07 DIAGNOSIS — E1011 Type 1 diabetes mellitus with ketoacidosis with coma: Secondary | ICD-10-CM | POA: Diagnosis not present

## 2020-09-07 LAB — COMPREHENSIVE METABOLIC PANEL
ALT: 19 U/L (ref 0–44)
AST: 32 U/L (ref 15–41)
Albumin: 3 g/dL — ABNORMAL LOW (ref 3.5–5.0)
Alkaline Phosphatase: 143 U/L — ABNORMAL HIGH (ref 38–126)
Anion gap: UNDETERMINED (ref 5–15)
BUN: 69 mg/dL — ABNORMAL HIGH (ref 6–20)
CO2: <7 mmol/L — ABNORMAL LOW (ref 22–32)
Calcium: 8.3 mg/dL — ABNORMAL LOW (ref 8.9–10.3)
Chloride: 94 mmol/L — ABNORMAL LOW (ref 98–111)
Creatinine, Ser: 4.24 mg/dL — ABNORMAL HIGH (ref 0.61–1.24)
GFR, Estimated: 16 mL/min — ABNORMAL LOW (ref >60)
Glucose, Bld: 1182 mg/dL (ref 70–99)
Potassium: >7.5 mmol/L (ref 3.5–5.1)
Sodium: 129 mmol/L — ABNORMAL LOW (ref 135–145)
Total Bilirubin: 1.4 mg/dL — ABNORMAL HIGH (ref 0.3–1.2)
Total Protein: 4.8 g/dL — ABNORMAL LOW (ref 6.5–8.1)

## 2020-09-07 LAB — GLUCOSE, CAPILLARY
Glucose-Capillary: 133 mg/dL — ABNORMAL HIGH (ref 70–99)
Glucose-Capillary: 230 mg/dL — ABNORMAL HIGH (ref 70–99)
Glucose-Capillary: 239 mg/dL — ABNORMAL HIGH (ref 70–99)
Glucose-Capillary: 253 mg/dL — ABNORMAL HIGH (ref 70–99)
Glucose-Capillary: 254 mg/dL — ABNORMAL HIGH (ref 70–99)
Glucose-Capillary: 311 mg/dL — ABNORMAL HIGH (ref 70–99)
Glucose-Capillary: 338 mg/dL — ABNORMAL HIGH (ref 70–99)
Glucose-Capillary: 406 mg/dL — ABNORMAL HIGH (ref 70–99)
Glucose-Capillary: 437 mg/dL — ABNORMAL HIGH (ref 70–99)
Glucose-Capillary: 565 mg/dL (ref 70–99)
Glucose-Capillary: >600 mg/dL (ref 70–99)
Glucose-Capillary: >600 mg/dL (ref 70–99)
Glucose-Capillary: >600 mg/dL (ref 70–99)
Glucose-Capillary: >600 mg/dL (ref 70–99)
Glucose-Capillary: >600 mg/dL (ref 70–99)
Glucose-Capillary: >600 mg/dL (ref 70–99)
Glucose-Capillary: >600 mg/dL (ref 70–99)
Glucose-Capillary: >600 mg/dL (ref 70–99)
Glucose-Capillary: >600 mg/dL (ref 70–99)
Glucose-Capillary: >600 mg/dL (ref 70–99)

## 2020-09-07 LAB — URINALYSIS, ROUTINE W REFLEX MICROSCOPIC
Bacteria, UA: NONE SEEN
Bilirubin Urine: NEGATIVE
Glucose, UA: >=500 mg/dL — AB
Ketones, ur: 5 mg/dL — AB
Leukocytes,Ua: NEGATIVE
Nitrite: NEGATIVE
Protein, ur: NEGATIVE mg/dL
Specific Gravity, Urine: 1.02 (ref 1.005–1.030)
pH: 5 (ref 5.0–8.0)

## 2020-09-07 LAB — BASIC METABOLIC PANEL
Anion gap: 20 — ABNORMAL HIGH (ref 5–15)
Anion gap: 14 (ref 5–15)
Anion gap: 12 (ref 5–15)
Anion gap: 8 (ref 5–15)
Anion gap: 5 (ref 5–15)
Anion gap: 3 — ABNORMAL LOW (ref 5–15)
BUN: 69 mg/dL — ABNORMAL HIGH (ref 6–20)
BUN: 71 mg/dL — ABNORMAL HIGH (ref 6–20)
BUN: 69 mg/dL — ABNORMAL HIGH (ref 6–20)
BUN: 65 mg/dL — ABNORMAL HIGH (ref 6–20)
BUN: 57 mg/dL — ABNORMAL HIGH (ref 6–20)
BUN: 54 mg/dL — ABNORMAL HIGH (ref 6–20)
CO2: 13 mmol/L — ABNORMAL LOW (ref 22–32)
CO2: 17 mmol/L — ABNORMAL LOW (ref 22–32)
CO2: 22 mmol/L (ref 22–32)
CO2: 23 mmol/L (ref 22–32)
CO2: 27 mmol/L (ref 22–32)
CO2: 26 mmol/L (ref 22–32)
Calcium: 8.1 mg/dL — ABNORMAL LOW (ref 8.9–10.3)
Calcium: 8.5 mg/dL — ABNORMAL LOW (ref 8.9–10.3)
Calcium: 8.5 mg/dL — ABNORMAL LOW (ref 8.9–10.3)
Calcium: 8.3 mg/dL — ABNORMAL LOW (ref 8.9–10.3)
Calcium: 8.3 mg/dL — ABNORMAL LOW (ref 8.9–10.3)
Calcium: 8.2 mg/dL — ABNORMAL LOW (ref 8.9–10.3)
Chloride: 98 mmol/L (ref 98–111)
Chloride: 105 mmol/L (ref 98–111)
Chloride: 106 mmol/L (ref 98–111)
Chloride: 111 mmol/L (ref 98–111)
Chloride: 111 mmol/L (ref 98–111)
Chloride: 113 mmol/L — ABNORMAL HIGH (ref 98–111)
Creatinine, Ser: 4.51 mg/dL — ABNORMAL HIGH (ref 0.61–1.24)
Creatinine, Ser: 4.02 mg/dL — ABNORMAL HIGH (ref 0.61–1.24)
Creatinine, Ser: 3.61 mg/dL — ABNORMAL HIGH (ref 0.61–1.24)
Creatinine, Ser: 3.08 mg/dL — ABNORMAL HIGH (ref 0.61–1.24)
Creatinine, Ser: 2.48 mg/dL — ABNORMAL HIGH (ref 0.61–1.24)
Creatinine, Ser: 2.32 mg/dL — ABNORMAL HIGH (ref 0.61–1.24)
GFR, Estimated: 14 mL/min — ABNORMAL LOW (ref >60)
GFR, Estimated: 17 mL/min — ABNORMAL LOW (ref >60)
GFR, Estimated: 19 mL/min — ABNORMAL LOW (ref >60)
GFR, Estimated: 23 mL/min — ABNORMAL LOW (ref >60)
GFR, Estimated: 30 mL/min — ABNORMAL LOW (ref >60)
GFR, Estimated: 32 mL/min — ABNORMAL LOW (ref >60)
Glucose, Bld: 1017 mg/dL (ref 70–99)
Glucose, Bld: 705 mg/dL (ref 70–99)
Glucose, Bld: 460 mg/dL — ABNORMAL HIGH (ref 70–99)
Glucose, Bld: 264 mg/dL — ABNORMAL HIGH (ref 70–99)
Glucose, Bld: 154 mg/dL — ABNORMAL HIGH (ref 70–99)
Glucose, Bld: 141 mg/dL — ABNORMAL HIGH (ref 70–99)
Potassium: 4.6 mmol/L (ref 3.5–5.1)
Potassium: 4.2 mmol/L (ref 3.5–5.1)
Potassium: 3.8 mmol/L (ref 3.5–5.1)
Potassium: 4.1 mmol/L (ref 3.5–5.1)
Potassium: 4.3 mmol/L (ref 3.5–5.1)
Potassium: 4.2 mmol/L (ref 3.5–5.1)
Sodium: 131 mmol/L — ABNORMAL LOW (ref 135–145)
Sodium: 136 mmol/L (ref 135–145)
Sodium: 140 mmol/L (ref 135–145)
Sodium: 142 mmol/L (ref 135–145)
Sodium: 143 mmol/L (ref 135–145)
Sodium: 142 mmol/L (ref 135–145)

## 2020-09-07 LAB — BETA-HYDROXYBUTYRIC ACID
Beta-Hydroxybutyric Acid: >8 mmol/L — ABNORMAL HIGH (ref 0.05–0.27)
Beta-Hydroxybutyric Acid: >8 mmol/L — ABNORMAL HIGH (ref 0.05–0.27)
Beta-Hydroxybutyric Acid: 6.81 mmol/L — ABNORMAL HIGH (ref 0.05–0.27)
Beta-Hydroxybutyric Acid: 2.7 mmol/L — ABNORMAL HIGH (ref 0.05–0.27)

## 2020-09-07 LAB — BLOOD GAS, ARTERIAL
Acid-base deficit: 4.3 mmol/L — ABNORMAL HIGH (ref 0.0–2.0)
Bicarbonate: 17.8 mmol/L — ABNORMAL LOW (ref 20.0–28.0)
Drawn by: 60087
FIO2: 40
O2 Saturation: 99.6 %
Patient temperature: 36.4
pCO2 arterial: 19.6 mmHg — CL (ref 32.0–48.0)
pH, Arterial: 7.563 — ABNORMAL HIGH (ref 7.350–7.450)
pO2, Arterial: 191 mmHg — ABNORMAL HIGH (ref 83.0–108.0)

## 2020-09-07 LAB — BLOOD GAS, VENOUS
Acid-base deficit: 12 mmol/L — ABNORMAL HIGH (ref 0.0–2.0)
Bicarbonate: 13.3 mmol/L — ABNORMAL LOW (ref 20.0–28.0)
O2 Saturation: 68.1 %
Patient temperature: 37
pCO2, Ven: 28.7 mmHg — ABNORMAL LOW (ref 44.0–60.0)
pH, Ven: 7.289 (ref 7.250–7.430)

## 2020-09-07 LAB — SARS CORONAVIRUS 2 (TAT 6-24 HRS): SARS Coronavirus 2: POSITIVE — AB

## 2020-09-07 LAB — HEMOGLOBIN A1C
Hgb A1c MFr Bld: 8.7 % — ABNORMAL HIGH (ref 4.8–5.6)
Mean Plasma Glucose: 203 mg/dL

## 2020-09-07 LAB — HIV ANTIBODY (ROUTINE TESTING W REFLEX): HIV Screen 4th Generation wRfx: NONREACTIVE

## 2020-09-07 LAB — MRSA PCR SCREENING: MRSA by PCR: NEGATIVE

## 2020-09-07 MED ORDER — PANTOPRAZOLE SODIUM 40 MG PO TBEC
40.0000 mg | DELAYED_RELEASE_TABLET | Freq: Every day | ORAL | Status: DC
  Administered 2020-09-07 – 2020-09-09 (×3): 40 mg via ORAL
  Filled 2020-09-07 (×3): qty 1

## 2020-09-07 MED ORDER — CHLORHEXIDINE GLUCONATE CLOTH 2 % EX PADS
6.0000 | MEDICATED_PAD | Freq: Every day | CUTANEOUS | Status: DC
  Administered 2020-09-09: 6 via TOPICAL

## 2020-09-07 MED ORDER — ASPIRIN 325 MG PO TABS
325.0000 mg | ORAL_TABLET | Freq: Every day | ORAL | Status: DC
  Administered 2020-09-07 – 2020-09-09 (×3): 325 mg via ORAL
  Filled 2020-09-07 (×3): qty 1

## 2020-09-07 MED ORDER — CLOPIDOGREL BISULFATE 75 MG PO TABS: 75.0000 mg | ORAL_TABLET | Freq: Every day | ORAL | Status: DC

## 2020-09-07 MED ORDER — INSULIN DETEMIR 100 UNIT/ML ~~LOC~~ SOLN
18.0000 [IU] | Freq: Two times a day (BID) | SUBCUTANEOUS | Status: DC
  Administered 2020-09-07 – 2020-09-09 (×4): 18 [IU] via SUBCUTANEOUS
  Filled 2020-09-07 (×5): qty 0.18

## 2020-09-07 MED ORDER — ASPIRIN 325 MG PO TABS: 325.0000 mg | ORAL_TABLET | Freq: Every day | ORAL | Status: DC

## 2020-09-07 MED ORDER — CHLORHEXIDINE GLUCONATE 0.12% ORAL RINSE (MEDLINE KIT)
15.0000 mL | Freq: Two times a day (BID) | OROMUCOSAL | Status: DC
  Administered 2020-09-07 – 2020-09-09 (×4): 15 mL via OROMUCOSAL

## 2020-09-07 MED ORDER — ORAL CARE MOUTH RINSE
15.0000 mL | OROMUCOSAL | Status: DC
  Administered 2020-09-07 (×3): 15 mL via OROMUCOSAL

## 2020-09-07 MED ORDER — PREDNISOLONE ACETATE 1 % OP SUSP
1.0000 [drp] | Freq: Four times a day (QID) | OPHTHALMIC | Status: DC
  Administered 2020-09-07 – 2020-09-09 (×8): 1 [drp] via OPHTHALMIC
  Filled 2020-09-07: qty 5

## 2020-09-07 MED ORDER — INSULIN ASPART 100 UNIT/ML IJ SOLN
1.0000 [IU] | INTRAMUSCULAR | Status: DC
  Administered 2020-09-07 – 2020-09-08 (×2): 3 [IU] via SUBCUTANEOUS
  Administered 2020-09-08: 2 [IU] via SUBCUTANEOUS
  Administered 2020-09-08 – 2020-09-09 (×4): 1 [IU] via SUBCUTANEOUS
  Administered 2020-09-09: 2 [IU] via SUBCUTANEOUS
  Administered 2020-09-09: 3 [IU] via SUBCUTANEOUS

## 2020-09-07 MED ORDER — CLOPIDOGREL BISULFATE 75 MG PO TABS
75.0000 mg | ORAL_TABLET | Freq: Every day | ORAL | Status: DC
  Administered 2020-09-07 – 2020-09-09 (×3): 75 mg via ORAL
  Filled 2020-09-07 (×3): qty 1

## 2020-09-07 NOTE — Plan of Care (Signed)
  Problem: Clinical Measurements: Goal: Will remain free from infection Outcome: Progressing Goal: Diagnostic test results will improve Outcome: Progressing Goal: Respiratory complications will improve Outcome: Progressing Goal: Cardiovascular complication will be avoided Outcome: Progressing   Problem: Activity: Goal: Risk for activity intolerance will decrease Outcome: Progressing   Problem: Coping: Goal: Level of anxiety will decrease Outcome: Progressing   Problem: Elimination: Goal: Will not experience complications related to bowel motility Outcome: Progressing Goal: Will not experience complications related to urinary retention Outcome: Progressing   Problem: Pain Managment: Goal: General experience of comfort will improve Outcome: Progressing   Problem: Safety: Goal: Ability to remain free from injury will improve Outcome: Progressing   Problem: Skin Integrity: Goal: Risk for impaired skin integrity will decrease Outcome: Progressing   Problem: Education: Goal: Ability to describe self-care measures that may prevent or decrease complications (Diabetes Survival Skills Education) will improve Outcome: Progressing Goal: Individualized Educational Video(s) Outcome: Progressing   Problem: Coping: Goal: Ability to adjust to condition or change in health will improve Outcome: Progressing   Problem: Fluid Volume: Goal: Ability to maintain a balanced intake and output will improve Outcome: Progressing   Problem: Health Behavior/Discharge Planning: Goal: Ability to identify and utilize available resources and services will improve Outcome: Progressing Goal: Ability to manage health-related needs will improve Outcome: Progressing   Problem: Nutritional: Goal: Progress toward achieving an optimal weight will improve Outcome: Progressing   Problem: Skin Integrity: Goal: Risk for impaired skin integrity will decrease Outcome: Progressing   Problem: Tissue  Perfusion: Goal: Adequacy of tissue perfusion will improve Outcome: Progressing   Problem: Education: Goal: Knowledge of General Education information will improve Description: Including pain rating scale, medication(s)/side effects and non-pharmacologic comfort measures Outcome: Not Progressing   Problem: Health Behavior/Discharge Planning: Goal: Ability to manage health-related needs will improve Outcome: Not Progressing   Problem: Clinical Measurements: Goal: Ability to maintain clinical measurements within normal limits will improve Outcome: Not Progressing   Problem: Nutrition: Goal: Adequate nutrition will be maintained Outcome: Not Progressing   Problem: Metabolic: Goal: Ability to maintain appropriate glucose levels will improve Outcome: Not Progressing   Problem: Nutritional: Goal: Maintenance of adequate nutrition will improve Outcome: Not Progressing

## 2020-09-07 NOTE — Progress Notes (Signed)
eLink Physician-Brief Progress Note Patient Name: MAXAMILLION BANAS DOB: [birth year] MRN: [id number]   Date of Service  09/07/2020  HPI/Events of Note  Last pH at 9:39 PM = 7.1.  eICU Interventions  Plan: 1. Repeat ABG at 1 AM.     Intervention Category Major Interventions: Acid-Base disturbance - evaluation and management;Respiratory failure - evaluation and management  Zoha Spranger Dennard Nip 09/07/2020, 12:05 AM

## 2020-09-07 NOTE — Progress Notes (Signed)
Family with pt

## 2020-09-07 NOTE — Progress Notes (Signed)
Attempts to phone pt wife x 2 by this RN unsuccessful in connection.  This RN dialing number confirmed with wife prior to her leaving, but there is no person or voicemail message to answer.  After multiple rings, the call simply goes to music then a beep.  This RN hesitant to leave pt information given this. This RN attempting to inform pt's spouse of pt's COVID + status and room change.

## 2020-09-07 NOTE — Progress Notes (Signed)
Tried to give report x2, Rn will attempt again. Provided callback [phone].

## 2020-09-07 NOTE — Progress Notes (Signed)
eLink Physician-Brief Progress Note Patient Name: Cory Nash DOB: May 19, 1960 MRN: [id number]   Date of Service  09/07/2020  HPI/Events of Note  Urinary retention - Bladder scan with 1000 mL residual.   eICU Interventions  Plan: 1. I/O Cath PRN.     Intervention Category Major Interventions: Other:  Lenell Antu 09/07/2020, 4:42 AM

## 2020-09-07 NOTE — Progress Notes (Addendum)
[facility] Daily Progress Note  NAME:  Cory Nash, MRN:  [id number], DOB:  Feb 24, 1961, LOS: 1 ADMISSION DATE:  09/06/2020, CONSULTATION DATE: 09/06/20 REFERRING MD: Dr. Zenia Resides, ED, CHIEF COMPLAINT:  Hyperglycemia, N/V, agitation  History of Present Illness:  60 year old man with a history of DM1, here with DKA.  History obtained from his wife Angie at the bedside.   Yesterday his blood sugars were running high in the 400s.   He was also vomiting (non bloody), poor po intake.  Somewhat agitated, refusing to go to hospital.  In the ED he developed worsening altered mental status, worsening tachypnea.   Of note, he was incidentally found to be COVID positive.  Per discussions with his wife, wife and child had COVID 2 weeks prior but pt had tested negative.  He had been asymptomatic prior to this admission.  Pertinent  Medical History  DM1 HTN  Significant Hospital Events: Including procedures, antibiotic start and stop dates in addition to other pertinent events   5/23 > admit, intubated in ED. 5/24 > extubated.  Interim History / Subjective:  CBGs slightly improved but still high. AG improved at 14 but CO2 still low at 17. Awake on vent.  Tolerated PSV 10/5 well with good MV around 13 - 16 with fentanyl infusion turned off. ABG with resp alkalosis + met acidosis.  Objective   Blood pressure 117/66, pulse 79, temperature (!) 97.5 F (36.4 C), temperature source Oral, resp. rate (!) 23, height 5' 10" (1.778 m), weight 75 kg, SpO2 98 %.    Vent Mode: PRVC FiO2 (%):  [40 %-100 %] 40 % Set Rate:  [26 bmp-30 bmp] 30 bmp Vt Set:  [580 mL] 580 mL PEEP:  [5 cmH20] 5 cmH20 Plateau Pressure:  [15 cmH20-16 cmH20] 16 cmH20   Intake/Output Summary (Last 24 hours) at 09/07/2020 0756 Last data filed at 09/07/2020 0533 Gross per 24 hour  Intake 2512.49 ml  Output 750 ml  Net 1762.49 ml   Filed Weights   09/06/20 1858 09/06/20 2345  Weight: 76.2 kg 75 kg     Examination: General: Adult male, resting in bed, in NAD. Neuro: Opens eyes to voice, follows basic commands.  MAE's. HEENT: [place]/AT. Sclerae anicteric. ETT in place. Cardiovascular: RRR, no M/R/G.  Lungs: Respirations even and unlabored.  CTA bilaterally, No W/R/R. Abdomen: BS x 4, soft, NT/ND.  Musculoskeletal: No gross deformities, no edema.  Skin: Intact, warm, no rashes.   Resolved Hospital Problem list     Assessment & Plan:   DKA with hx underlying DM1 - presumed 2/2 asymptomatic COVID infection (incidentally found to be COVID + on admission).  Of note, per notes his home insulin pump was removed in ED. - Continue insulin infusion per DKA protocol. - Frequent BMP's - Hold home agents.  Acute hypoxic respiratory failure with severe acidosis and inability to protect the airway - 2/2 above, s/p intubation in ED.  This AM, ABG and mental status much improved, tolerating PSV wean well with good vent mechanics. - Extubate now. - Bronchial hygiene. - Mobilize once able.  AoCKD - presumed 2/2 above + dehydration.  Slightly oliguric. - Continue fluids. - Monitor I/O's and follow BMP.  Hx HTN, HLD. - Hold home Amlodipine, Atorvastatin, Carvedilol, Clonidine, Losartan.  Hx CVA. - Resume home ASA, Plavix.  Hx GERD. - Resume home PPI.  Extubate this AM.  If remains stable through the morning, will transfer out of ICU this PM and ask  TRH to assume care in AM 5/25 with PCCM off.  Will check back this afternoon for ultimate decision.  Called and updated his wife Angie of above as well as COVID + finding.  She informed me that she and her child were both COVID + 2 weeks prior, but pt never tested positive and had been asymptomatic prior to this admission.   Critical care time: 30 minutes    Montey Hora, [place] [facility] For pager details, please see AMION or use Epic chat  After 1900, please call [place] for cross coverage needs 09/07/2020,  8:16 AM

## 2020-09-07 NOTE — Progress Notes (Signed)
eLink Physician-Brief Progress Note Patient Name: Cory Nash DOB: [birth year] MRN: [id number]   Date of Service  09/07/2020  HPI/Events of Note  Venous blood gas = 7.289/28.7/NA/13.3.  eICU Interventions  Plan: 1. ABG at 7:30 AM.     Intervention Category Major Interventions: Acid-Base disturbance - evaluation and management;Respiratory failure - evaluation and management  Emre Stock Dennard Nip 09/07/2020, 6:24 AM

## 2020-09-07 NOTE — Progress Notes (Signed)
This RN left generic "if you have a loved one at [facility], we are calling to give you an update and need you to return our call at [phone]," message for pt wife as again generic answering machine reached.

## 2020-09-07 NOTE — Procedures (Signed)
Extubation Procedure Note  Patient Details:   Name: Cory Nash DOB: 08/12/1960 MRN: [id number]   Airway Documentation:    Vent end date: 09/07/20 Vent end time: 0757   Evaluation  O2 sats: stable throughout Complications: No apparent complications Patient did tolerate procedure well. Bilateral Breath Sounds: Clear,Diminished   Patient extubated per MD order & placed on 3L [place]. Patient able to speak & cough post extubation.  Jacqulynn Cadet 09/07/2020, 8:10 AM

## 2020-09-07 NOTE — Progress Notes (Signed)
Inpatient Diabetes Program Recommendations  AACE/ADA: New Consensus Statement on Inpatient Glycemic Control (2015)  Target Ranges:  Prepandial:   less than 140 mg/dL      Peak postprandial:   less than 180 mg/dL (1-2 hours)      Critically ill patients:  140 - 180 mg/dL   Lab Results  Component Value Date   GLUCAP 437 (H) 09/07/2020    Review of Glycemic Control  Diabetes history: DM1 Outpatient Diabetes medications: T Slim Insulin pump (see settings below) Current orders for Inpatient glycemic control: IV Insulin  Inpatient Diabetes Program Recommendations:   Patient sees endocrinologist Dr. Yetta Barre for type 1 diabetes management. Last office visit 07/07/20 with settings listed below.  "Diabetes: On T-Slim insulin pump, started early 2018.  In Control IQ Mode and wearing this with a DexCom G6.  Current pump settings are as follows: MN 1.8; 9am 1.35; 3pm 1.85; 10pm 1.7. Total 40.7 units/day.  Insulin:carb ratio at MN to 1u:15g; 9am 1:13g; 5pm 1u:10g; 10pm 1u:10g.  Sensitivity to MN 70; 9am 75; 5pm 70; 10pm 70. Target 110." .   Will follow and assist as needed.   Thank you, Cory Nash. Cory Brazill, RN, MSN, CDE  Diabetes Coordinator Inpatient Glycemic Control Team Team Pager [phone] (8am-5pm) 09/07/2020 8:58 AM

## 2020-09-08 LAB — GLUCOSE, CAPILLARY
Glucose-Capillary: 126 mg/dL — ABNORMAL HIGH (ref 70–99)
Glucose-Capillary: 135 mg/dL — ABNORMAL HIGH (ref 70–99)
Glucose-Capillary: 142 mg/dL — ABNORMAL HIGH (ref 70–99)
Glucose-Capillary: 191 mg/dL — ABNORMAL HIGH (ref 70–99)
Glucose-Capillary: 228 mg/dL — ABNORMAL HIGH (ref 70–99)
Glucose-Capillary: 232 mg/dL — ABNORMAL HIGH (ref 70–99)

## 2020-09-08 LAB — BASIC METABOLIC PANEL
Anion gap: 4 — ABNORMAL LOW (ref 5–15)
BUN: 49 mg/dL — ABNORMAL HIGH (ref 6–20)
CO2: 26 mmol/L (ref 22–32)
Calcium: 8.4 mg/dL — ABNORMAL LOW (ref 8.9–10.3)
Chloride: 113 mmol/L — ABNORMAL HIGH (ref 98–111)
Creatinine, Ser: 2.04 mg/dL — ABNORMAL HIGH (ref 0.61–1.24)
GFR, Estimated: 37 mL/min — ABNORMAL LOW (ref >60)
Glucose, Bld: 135 mg/dL — ABNORMAL HIGH (ref 70–99)
Potassium: 3.9 mmol/L (ref 3.5–5.1)
Sodium: 143 mmol/L (ref 135–145)

## 2020-09-08 LAB — CBC
HCT: 33.2 % — ABNORMAL LOW (ref 39.0–52.0)
Hemoglobin: 11.8 g/dL — ABNORMAL LOW (ref 13.0–17.0)
MCH: 31 pg (ref 26.0–34.0)
MCHC: 35.5 g/dL (ref 30.0–36.0)
MCV: 87.1 fL (ref 80.0–100.0)
Platelets: 125 10*3/uL — ABNORMAL LOW (ref 150–400)
RBC: 3.81 MIL/uL — ABNORMAL LOW (ref 4.22–5.81)
RDW: 12.7 % (ref 11.5–15.5)
WBC: 13.7 10*3/uL — ABNORMAL HIGH (ref 4.0–10.5)
nRBC: 0 % (ref 0.0–0.2)

## 2020-09-08 LAB — MAGNESIUM: Magnesium: 2.2 mg/dL (ref 1.7–2.4)

## 2020-09-08 LAB — PHOSPHORUS: Phosphorus: 2.3 mg/dL — ABNORMAL LOW (ref 2.5–4.6)

## 2020-09-08 MED ORDER — SODIUM CHLORIDE 0.9 % IV SOLN: INTRAVENOUS | Status: DC

## 2020-09-08 NOTE — Plan of Care (Signed)

## 2020-09-08 NOTE — Progress Notes (Signed)
PROGRESS NOTE    Cory Nash  MCN:[id number] DOB: 1960/06/26 DOA: 09/06/2020 PCP: Myrlene Broker, MD    Brief Narrative:  60 year old gentleman with type 1 diabetes on insulin pump who developed vomiting and poor oral intake for the last few days, high blood sugars and confusion so EMS was called and brought to the ER.  Patient was complaining of some URI symptoms for a week.  He thinks his family had COVID 2 weeks ago. 5/23: Confusion agitation and profound metabolic acidosis and encephalopathy and unable to protect airway, he was intubated and mechanical ventilation.  He was found to be DKA with encephalopathy, started on insulin infusion and admitted to the intensive care unit. 5/24: Extubated to room air, anion gap closed, mental status improved then transferred to medical floor. Found to have positive for COVID-19, significantly abnormal renal functions.   Assessment & Plan:   Active Problems:   DKA (diabetic ketoacidosis) ([place])  Diabetic ketoacidosis, type 1 diabetes with uncontrolled blood sugars.  Patient on insulin pump at home. Developed DKA likely exacerbated by Covid 19 infection. Anion gap closed , HbA1c 8.7 Patient was started on long-acting insulin twice a day and short acting insulin.  Blood sugars are fairly stable.  Symptomatically improving.  Continue current doses today.  If his renal functions improved and appetite improves, we will go back on insulin pump on discharge.  Acute hypoxemic respiratory failure due to metabolic acidosis and encephalopathy, acute metabolic encephalopathy: Currently on room air.  Start mobility and continue breathing exercises.  Acute kidney injury with underlying chronic kidney disease stage II: Known baseline creatinine 1.2-1.3.  Presented with creatinine of more than 4 due to severe electrolyte abnormalities and dehydration.  Gradually improving.  Urine output is adequate. We will start patient on isotonic fluid today, monitor  levels.  Monitor urine output.  Electrolytes are adequate.  COVID-19 viral infection: Asymptomatic.  Nausea improved.  No indication for antivirals.  Hypertension and hyperlipidemia: Holding all antihypertensives.  Will allow renal functions to recover before restarting.  History of a stroke: No evidence of new neurological deficits.  On aspirin Plavix.  Statin on hold.   DVT prophylaxis: heparin injection 5,000 Units Start: 09/06/20 2200 SCDs Start: 09/06/20 2125   Code Status: Full code Family Communication: None at the bedside Disposition Plan: Status is: Inpatient  Remains inpatient appropriate because:IV treatments appropriate due to intensity of illness or inability to take PO and Inpatient level of care appropriate due to severity of illness   Dispo: The patient is from: Home              Anticipated d/c is to: Home              Patient currently is not medically stable to d/c.   Difficult to place patient No         Consultants:   Critical care  Procedures:   None  Antimicrobials:   None   Subjective: Patient seen and examined.  No overnight events.  His breakfast was not delivered and he was quite distressed being hungry.  Denies any nausea or vomiting.  Denies any cough or sputum production.  Afebrile.  Objective: Vitals:   09/08/20 0014 09/08/20 0444 09/08/20 0805 09/08/20 1225  BP: 111/67 127/72 134/73 126/71  Pulse: 76 77 71 72  Resp: _0 Temp: 98.5 F (36.9 C) 98.2 F (36.8 C) 98.4 F (36.9 C) 98.1 F (36.7 C)  TempSrc: Oral Oral Oral Oral  SpO2: 100% 100% 97% 97%  Weight:      Height:        Intake/Output Summary (Last 24 hours) at 09/08/2020 1411 Last data filed at 09/08/2020 0810 Gross per 24 hour  Intake 1781.67 ml  Output 1400 ml  Net 381.67 ml   Filed Weights   09/06/20 1858 09/06/20 2345 09/07/20 2100  Weight: 76.2 kg 75 kg 75.2 kg    Examination:  General exam: Appears calm and comfortable  Mostly on room air.   Without any distress.  Slightly anxious. Respiratory system: Clear to auscultation. Respiratory effort normal. Cardiovascular system: S1 & S2 heard, RRR. Gastrointestinal system: Abdomen is nondistended, soft and nontender. No organomegaly or masses felt. Normal bowel sounds heard. Central nervous system: Alert and oriented.  No obvious focal deficits. Extremities: Symmetric 5 x 5 power.    Data Reviewed: I have personally reviewed following labs and imaging studies  CBC: Recent Labs  Lab 09/06/20 1922 09/06/20 2004 09/06/20 2058 09/06/20 2139 09/08/20 0126  WBC 22.5* 17.9*  --   --  13.7*  HGB 12.1* 11.7* 12.2* 10.9* 11.8*  HCT 38.5* 40.7 36.0* 32.0* 33.2*  MCV 98.5 105.2*  --   --  87.1  PLT 241 196  --   --  654*   Basic Metabolic Panel: Recent Labs  Lab 09/07/20 0911 09/07/20 1338 09/07/20 2016 09/07/20 2213 09/08/20 0126  NA 140 142 143 142 143  K 3.8 4.1 4.3 4.2 3.9  CL 106 111 111 113* 113*  CO2 _0 GLUCOSE 460* 264* 154* 141* 135*  BUN 69* 65* 57* 54* 49*  CREATININE 3.61* 3.08* 2.48* 2.32* 2.04*  CALCIUM 8.5* 8.3* 8.3* 8.2* 8.4*  MG  --   --   --   --  2.2  PHOS  --   --   --   --  2.3*   GFR: Estimated Creatinine Clearance: 42.5 mL/min (A) (by C-G formula based on SCr of 2.04 mg/dL (H)). Liver Function Tests: Recent Labs  Lab 09/06/20 2004  AST 32  ALT 19  ALKPHOS 143*  BILITOT 1.4*  PROT 4.8*  ALBUMIN 3.0*   No results for input(s): LIPASE, AMYLASE in the last 168 hours. No results for input(s): AMMONIA in the last 168 hours. Coagulation Profile: No results for input(s): INR, PROTIME in the last 168 hours. Cardiac Enzymes: No results for input(s): CKTOTAL, CKMB, CKMBINDEX, TROPONINI in the last 168 hours. BNP (last 3 results) No results for input(s): PROBNP in the last 8760 hours. HbA1C: Recent Labs    09/06/20 2004  HGBA1C 8.7*   CBG: Recent Labs  Lab 09/07/20 1933 09/08/20 0017 09/08/20 0454 09/08/20 0817  09/08/20 1228  GLUCAP 133* 142* 126* 135* 228*   Lipid Profile: No results for input(s): CHOL, HDL, LDLCALC, TRIG, CHOLHDL, LDLDIRECT in the last 72 hours. Thyroid Function Tests: No results for input(s): TSH, T4TOTAL, FREET4, T3FREE, THYROIDAB in the last 72 hours. Anemia Panel: No results for input(s): VITAMINB12, FOLATE, FERRITIN, TIBC, IRON, RETICCTPCT in the last 72 hours. Sepsis Labs: No results for input(s): PROCALCITON, LATICACIDVEN in the last 168 hours.  Recent Results (from the past 240 hour(s))  SARS CORONAVIRUS 2 (TAT 6-24 HRS) Nasopharyngeal Nasopharyngeal Swab     Status: Abnormal   Collection Time: 09/06/20  9:45 PM   Specimen: Nasopharyngeal Swab  Result Value Ref Range Status   SARS Coronavirus 2 POSITIVE (A) NEGATIVE Final    Comment: (NOTE) SARS-CoV-2 target nucleic acids are  DETECTED.  The SARS-CoV-2 RNA is generally detectable in upper and lower respiratory specimens during the acute phase of infection. Positive results are indicative of the presence of SARS-CoV-2 RNA. Clinical correlation with patient history and other diagnostic information is  necessary to determine patient infection status. Positive results do not rule out bacterial infection or co-infection with other viruses.  The expected result is Negative.  Fact Sheet for Patients: SugarRoll.be  Fact Sheet for Healthcare Providers: https://www.woods-mathews.com/  This test is not yet approved or cleared by the [place] FDA and  has been authorized for detection and/or diagnosis of SARS-CoV-2 by FDA under an Emergency Use Authorization (EUA). This EUA will remain  in effect (meaning this test can be used) for the duration of the COVID-19 declaration under Section 564(b)(1) of the Act, 21 U. S.C. section 360bbb-3(b)(1), unless the authorization is terminated or revoked sooner.   Performed at [facility], [place] [address] [place],  [place] [address]   Culture, blood (routine x 2)     Status: None (Preliminary result)   Collection Time: 09/06/20  9:51 PM   Specimen: BLOOD  Result Value Ref Range Status   Specimen Description BLOOD LEFT UPPER ARM  Final   Special Requests   Final    BOTTLES DRAWN AEROBIC AND ANAEROBIC Blood Culture adequate volume   Culture   Final    NO GROWTH 2 DAYS Performed at [facility], [place] [address] [place], [place] [address]    Report Status PENDING  Incomplete  Culture, blood (routine x 2)     Status: None (Preliminary result)   Collection Time: 09/06/20 10:15 PM   Specimen: BLOOD RIGHT HAND  Result Value Ref Range Status   Specimen Description BLOOD RIGHT HAND  Final   Special Requests   Final    BOTTLES DRAWN AEROBIC AND ANAEROBIC Blood Culture results may not be optimal due to an inadequate volume of blood received in culture bottles   Culture   Final    NO GROWTH 2 DAYS Performed at [facility], [place] [address], [place],  [address]    Report Status PENDING  Incomplete  MRSA PCR Screening     Status: None   Collection Time: 09/06/20 11:49 PM   Specimen: Nasal Mucosa; Nasopharyngeal  Result Value Ref Range Status   MRSA by PCR NEGATIVE NEGATIVE Final    Comment:        The GeneXpert MRSA Assay (FDA approved for NASAL specimens only), is one component of a comprehensive MRSA colonization surveillance program. It is not intended to diagnose MRSA infection nor to guide or monitor treatment for MRSA infections. Performed at [facility], [place] [address], [place],  [address]          Radiology Studies: DG Chest Port 1 View  Result Date: 09/06/2020 CLINICAL DATA:  Encounter for intubation.  Shortness of breath. EXAM: PORTABLE CHEST 1 VIEW COMPARISON:  Cxr 08/22/17. FINDINGS: Endotracheal tube terminates 3.5 cm above the carina. The heart size and mediastinal contours are within normal limits. Biapical pleural/pulmonary scarring. No focal  consolidation. No pulmonary edema. No pleural effusion. No pneumothorax. No acute osseous abnormality. IMPRESSION: No active disease. Electronically Signed   By: Iven Finn M.D.   On: 09/06/2020 21:43   DG Abd Portable 1V  Result Date: 09/07/2020 CLINICAL DATA:  Orogastric tube placed EXAM: PORTABLE ABDOMEN - 1 VIEW COMPARISON:  None. FINDINGS: Nasogastric tube tip noted overlying the a mid body  of the stomach. Normal abdominal gas pattern. No gross free intraperitoneal gas. IMPRESSION: Nasogastric tube tip within the mid body of the stomach. Electronically Signed   By: Fidela Salisbury MD   On: 09/07/2020 00:51        Scheduled Meds: . aspirin  325 mg Oral Daily  . chlorhexidine gluconate (MEDLINE KIT)  15 mL Mouth Rinse BID  . Chlorhexidine Gluconate Cloth  6 each Topical Daily  . clopidogrel  75 mg Oral Daily  . heparin  5,000 Units Subcutaneous Q8H  . insulin aspart  1-3 Units Subcutaneous Q4H  . insulin detemir  18 Units Subcutaneous Q12H  . pantoprazole  40 mg Oral Q1200  . prednisoLONE acetate  1 drop Both Eyes QID   Continuous Infusions: . sodium chloride 100 mL/hr at 09/08/20 1244  . calcium gluconate       LOS: 2 days    Time spent: 35 minutes    Barb Merino, MD [facility] Pager [phone]

## 2020-09-09 LAB — CBC WITH DIFFERENTIAL/PLATELET
Abs Immature Granulocytes: 0.03 10*3/uL (ref 0.00–0.07)
Basophils Absolute: 0 10*3/uL (ref 0.0–0.1)
Basophils Relative: 0 %
Eosinophils Absolute: 0.1 10*3/uL (ref 0.0–0.5)
Eosinophils Relative: 1 %
HCT: 31.4 % — ABNORMAL LOW (ref 39.0–52.0)
Hemoglobin: 11 g/dL — ABNORMAL LOW (ref 13.0–17.0)
Immature Granulocytes: 1 %
Lymphocytes Relative: 25 %
Lymphs Abs: 1.4 10*3/uL (ref 0.7–4.0)
MCH: 31.3 pg (ref 26.0–34.0)
MCHC: 35 g/dL (ref 30.0–36.0)
MCV: 89.2 fL (ref 80.0–100.0)
Monocytes Absolute: 0.5 10*3/uL (ref 0.1–1.0)
Monocytes Relative: 9 %
Neutro Abs: 3.6 10*3/uL (ref 1.7–7.7)
Neutrophils Relative %: 64 %
Platelets: 92 10*3/uL — ABNORMAL LOW (ref 150–400)
RBC: 3.52 MIL/uL — ABNORMAL LOW (ref 4.22–5.81)
RDW: 12.5 % (ref 11.5–15.5)
WBC: 5.6 10*3/uL (ref 4.0–10.5)
nRBC: 0 % (ref 0.0–0.2)

## 2020-09-09 LAB — BASIC METABOLIC PANEL
Anion gap: 5 (ref 5–15)
BUN: 26 mg/dL — ABNORMAL HIGH (ref 6–20)
CO2: 25 mmol/L (ref 22–32)
Calcium: 8 mg/dL — ABNORMAL LOW (ref 8.9–10.3)
Chloride: 108 mmol/L (ref 98–111)
Creatinine, Ser: 1.2 mg/dL (ref 0.61–1.24)
GFR, Estimated: >60 mL/min (ref >60)
Glucose, Bld: 214 mg/dL — ABNORMAL HIGH (ref 70–99)
Potassium: 3.8 mmol/L (ref 3.5–5.1)
Sodium: 138 mmol/L (ref 135–145)

## 2020-09-09 LAB — GLUCOSE, CAPILLARY
Glucose-Capillary: 114 mg/dL — ABNORMAL HIGH (ref 70–99)
Glucose-Capillary: 153 mg/dL — ABNORMAL HIGH (ref 70–99)
Glucose-Capillary: 165 mg/dL — ABNORMAL HIGH (ref 70–99)
Glucose-Capillary: 211 mg/dL — ABNORMAL HIGH (ref 70–99)

## 2020-09-09 LAB — MAGNESIUM: Magnesium: 2.2 mg/dL (ref 1.7–2.4)

## 2020-09-09 LAB — PHOSPHORUS: Phosphorus: 1.8 mg/dL — ABNORMAL LOW (ref 2.5–4.6)

## 2020-09-09 MED ORDER — POTASSIUM PHOSPHATES 15 MMOLE/5ML IV SOLN
20.0000 mmol | Freq: Once | INTRAVENOUS | Status: AC
  Administered 2020-09-09: 20 mmol via INTRAVENOUS
  Filled 2020-09-09: qty 6.67

## 2020-09-09 NOTE — Discharge Summary (Addendum)
Physician Discharge Summary  Cory Nash DOB: 08/27/1960 DOA: 09/06/2020  PCP: Hadley Penobbins, Robert A, MD  Admit date: 09/06/2020 Discharge date: 09/09/2020  Admitted From: Home Disposition: Home  Recommendations for Outpatient Follow-up:  1. Follow up with PCP in 1 week 2. Please obtain BMP/CBC in one week 3. Some of the blood pressure medications have not been resumed, will have follow-up at the doctor's office.  Home Health: Not applicable Equipment/Devices: Not applicable  Discharge Condition: Stable CODE STATUS: Full code Diet recommendation: Low-salt, low-carb diet.  Plenty of fluid.  Discharge summary: 60 year old gentleman with type 1 diabetes on insulin pump who developed vomiting and poor oral intake for the last few days, high blood sugars and confusion so EMS was called and brought to the ER.  Patient was complaining of some URI symptoms for a week. His household suffered from COVID-19 infection 2 weeks ago.  5/23: Confusion agitation and profound metabolic acidosis and encephalopathy and unable to protect airway, he was intubated and mechanical ventilation.  He was found to be DKA with encephalopathy, started on insulin infusion and admitted to the intensive care unit.  5/24: Extubated to room air, anion gap closed, mental status improved then transferred to medical floor. Found to have positive for COVID-19, significantly abnormal renal functions.  # Diabetic ketoacidosis, type 1 diabetes with uncontrolled blood sugars.  Patient on insulin pump at home. Developed DKA likely exacerbated by Covid 19 infection. Anion gap closed , HbA1c 8.7 Patient was started on long-acting insulin twice a day and short acting insulin.  Blood sugars are fairly stable. Symptomatically improving.  Renal functions normalized.  He will go home and resume his insulin doses through the insulin pump.  # Acute hypoxemic respiratory failure due to metabolic acidosis and encephalopathy,  acute metabolic encephalopathy: Currently on room air.    Adequately improved.  # Acute kidney injury with underlying chronic kidney disease stage II: Known baseline creatinine 1.2-1.3.  Presented with creatinine of more than 4 due to severe electrolyte abnormalities and dehydration.  Normalized to his baseline. Able to keep up with oral hydration. Hold losartan and clonidine until follow-up and repeat renal function.  Will resume amlodipine and carvedilol. Potassium and phosphorus replaced before discharge.  # COVID-19 viral infection: Asymptomatic.  Nausea improved.  No indication for antivirals.  # Hypertension: Blood pressure is stabilizing now.  On multiple antihypertensives.  Resume amlodipine and carvedilol today.  Blood pressure 135/85.  Patient is on clonidine 0.3 mg 3 times a day, no evidence of withdrawal.  We will continue to hold.  Patient on losartan, will continue to hold.  At this time he does not need all 4 antihypertensives.  He may need some time to equilibrate.  Will request follow-up at Dr. Sherral Hammersobbins office within 1 week to recheck his blood pressure and renal function test and go back on clonidine and losartan if needed.   #History of stroke: Patient on Lipitor that he will resume.  Patient on dual antiplatelet therapy with aspirin 325 mg and Plavix 75 mg daily.  He is on a higher dose of aspirin when it is given in combination to Plavix, however maintained on this therapy so I will not change it.   Adequately stabilized to go home.  Discharge Diagnoses:  Active Problems:   DKA (diabetic ketoacidosis) [facility](HCC)    Discharge Instructions  Discharge Instructions    Call MD for:  difficulty breathing, headache or visual disturbances   Complete by: As directed    Call  MD for:  persistant dizziness or light-headedness   Complete by: As directed    Call MD for:  persistant nausea and vomiting   Complete by: As directed    Call MD for:  temperature >100.4   Complete by:  As directed    Diet - low sodium heart healthy   Complete by: As directed    Diet Carb Modified   Complete by: As directed    Discharge instructions   Complete by: As directed    Resume your insulin doses when you go home. Drink plenty of water.  Keep yourself hydrated. Isolation precautions for total 10 days from initial diagnosis until 6/2. To appear blood pressure medications are held and discontinued, you will follow-up at primary care physician's office and resume clonidine and losartan if blood pressure is elevated and kidney tests remain normal.   Increase activity slowly   Complete by: As directed      Allergies as of 09/09/2020   No Known Allergies     Medication List    STOP taking these medications   cloNIDine 0.3 MG tablet Commonly known as: CATAPRES   losartan 100 MG tablet Commonly known as: COZAAR   sevelamer 800 MG tablet Commonly known as: RENAGEL     TAKE these medications   amLODipine 10 MG tablet Commonly known as: NORVASC Take 10 mg by mouth daily.   aspirin EC 325 MG tablet Take 325 mg by mouth daily.   atorvastatin 40 MG tablet Commonly known as: LIPITOR Take 40 mg by mouth daily.   carvedilol 6.25 MG tablet Commonly known as: COREG Take 6.25 mg by mouth 2 (two) times daily.   clopidogrel 75 MG tablet Commonly known as: PLAVIX Take 75 mg by mouth daily.   gabapentin 300 MG capsule Commonly known as: NEURONTIN Take 300 mg by mouth 3 (three) times daily.   HYDROcodone-acetaminophen 5-325 MG tablet Commonly known as: NORCO/VICODIN Take 1-2 tablets by mouth every 6 (six) hours as needed for moderate pain.   hydroxypropyl methylcellulose / hypromellose 2.5 % ophthalmic solution Commonly known as: ISOPTO TEARS / GONIOVISC Place 1 drop into both eyes as needed for dry eyes.   hydrOXYzine 25 MG tablet Commonly known as: ATARAX/VISTARIL Take 25 mg by mouth daily.   insulin aspart 100 UNIT/ML injection Commonly known as:  novoLOG Inject into the skin See admin instructions. Uses pump   insulin pump Soln Inject into the skin See admin instructions. using Novolog 100 unit/ml  via insulin pump   neomycin-bacitracin-polymyxin ointment Commonly known as: NEOSPORIN Apply 1 application topically as needed for wound care.   pantoprazole 40 MG tablet Commonly known as: PROTONIX Take 40 mg by mouth daily.   prednisoLONE acetate 1 % ophthalmic suspension Commonly known as: PRED FORTE Place 1 drop into both eyes 4 (four) times daily.       Follow-up Information    Hadley Pen, MD Follow up in 2 week(s).   Specialty: Family Medicine Contact information: [address] [place] [address] [phone]              No Known Allergies  Consultations:  Critical care   Procedures/Studies: [facility] Chest Port 1 View  Result Date: 09/06/2020 CLINICAL DATA:  Encounter for intubation.  Shortness of breath. EXAM: PORTABLE CHEST 1 VIEW COMPARISON:  Cxr 08/22/17. FINDINGS: Endotracheal tube terminates 3.5 cm above the carina. The heart size and mediastinal contours are within normal limits. Biapical pleural/pulmonary scarring. No focal consolidation. No  pulmonary edema. No pleural effusion. No pneumothorax. No acute osseous abnormality. IMPRESSION: No active disease. Electronically Signed   By: Tish Frederickson M.D.   On: 09/06/2020 21:43   DG Abd Portable 1V  Result Date: 09/07/2020 CLINICAL DATA:  Orogastric tube placed EXAM: PORTABLE ABDOMEN - 1 VIEW COMPARISON:  None. FINDINGS: Nasogastric tube tip noted overlying the a mid body of the stomach. Normal abdominal gas pattern. No gross free intraperitoneal gas. IMPRESSION: Nasogastric tube tip within the mid body of the stomach. Electronically Signed   By: Helyn Numbers MD   On: 09/07/2020 00:51   (Echo, Carotid, EGD, Colonoscopy, ERCP)    Subjective: Patient seen and examined.  No overnight events.  Denies any nausea vomiting shortness of  breath.  Eager to go home.  Eating and drinking well.   Discharge Exam: Vitals:   09/09/20 0400 09/09/20 1220  BP: 123/71 (!) 153/87  Pulse: (!) 59 61  Resp: 16 15  Temp: 98 F (36.7 C) 98.6 F (37 C)  SpO2: 97% 98%   Vitals:   09/08/20 1631 09/08/20 2000 09/09/20 0400 09/09/20 1220  BP: 139/76 (!) 142/73 123/71 (!) 153/87  Pulse: 67 70 (!) 59 61  Resp: Temp: 98.7 F (37.1 C) 98.5 F (36.9 C) 98 F (36.7 C) 98.6 F (37 C)  TempSrc: Oral Oral Oral Axillary  SpO2: 97% 95% 97% 98%  Weight:      Height:        General: Pt is alert, awake, not in acute distress Cardiovascular: RRR, S1/S2 +, no rubs, no gallops Respiratory: CTA bilaterally, no wheezing, no rhonchi Abdominal: Soft, NT, ND, bowel sounds + Extremities: no edema, no cyanosis    The results of significant diagnostics from this hospitalization (including imaging, microbiology, ancillary and laboratory) are listed below for reference.     Microbiology: Recent Results (from the past 240 hour(s))  SARS CORONAVIRUS 2 (TAT 6-24 HRS) Nasopharyngeal Nasopharyngeal Swab     Status: Abnormal   Collection Time: 09/06/20  9:45 PM   Specimen: Nasopharyngeal Swab  Result Value Ref Range Status   SARS Coronavirus 2 POSITIVE (A) NEGATIVE Final    Comment: (NOTE) SARS-CoV-2 target nucleic acids are DETECTED.  The SARS-CoV-2 RNA is generally detectable in upper and lower respiratory specimens during the acute phase of infection. Positive results are indicative of the presence of SARS-CoV-2 RNA. Clinical correlation with patient history and other diagnostic information is  necessary to determine patient infection status. Positive results do not rule out bacterial infection or co-infection with other viruses.  The expected result is Negative.  Fact Sheet for Patients: HairSlick.no  Fact Sheet for Healthcare Providers: quierodirigir.com  This test is  not yet approved or cleared by the [place] FDA and  has been authorized for detection and/or diagnosis of SARS-CoV-2 by FDA under an Emergency Use Authorization (EUA). This EUA will remain  in effect (meaning this test can be used) for the duration of the COVID-19 declaration under Section 564(b)(1) of the Act, 21 U. S.C. section 360bbb-3(b)(1), unless the authorization is terminated or revoked sooner.   Performed at [facility], [address] [place], [place] [address]   Culture, blood (routine x 2)     Status: None (Preliminary result)   Collection Time: 09/06/20  9:51 PM   Specimen: BLOOD  Result Value Ref Range Status   Specimen Description BLOOD LEFT UPPER ARM  Final   Special Requests   Final  BOTTLES DRAWN AEROBIC AND ANAEROBIC Blood Culture adequate volume   Culture   Final    NO GROWTH 3 DAYS Performed at [facility], [address] [place], [place] [address]    Report Status PENDING  Incomplete  Culture, blood (routine x 2)     Status: None (Preliminary result)   Collection Time: 09/06/20 10:15 PM   Specimen: BLOOD RIGHT HAND  Result Value Ref Range Status   Specimen Description BLOOD RIGHT HAND  Final   Special Requests   Final    BOTTLES DRAWN AEROBIC AND ANAEROBIC Blood Culture results may not be optimal due to an inadequate volume of blood received in culture bottles   Culture   Final    NO GROWTH 3 DAYS Performed at [facility], [address] [place], [place] [address]    Report Status PENDING  Incomplete  MRSA PCR Screening     Status: None   Collection Time: 09/06/20 11:49 PM   Specimen: Nasal Mucosa; Nasopharyngeal  Result Value Ref Range Status   MRSA by PCR NEGATIVE NEGATIVE Final    Comment:        The GeneXpert MRSA Assay (FDA approved for NASAL specimens only), is one component of a comprehensive MRSA colonization surveillance program. It is not intended to diagnose MRSA infection nor to guide or monitor  treatment for MRSA infections. Performed at [facility], [address] [place], [place] [address]      Labs: BNP (last 3 results) No results for input(s): BNP in the last 8760 hours. Basic Metabolic Panel: Recent Labs  Lab 09/07/20 1338 09/07/20 2016 09/07/20 2213 09/08/20 0126 09/09/20 0136  NA 142 143 142 143 138  K 4.1 4.3 4.2 3.9 3.8  CL 111 111 113* 113* 108  CO2 23 27 26 26 25   GLUCOSE 264* 154* 141* 135* 214*  BUN 65* 57* 54* 49* 26*  CREATININE 3.08* 2.48* 2.32* 2.04* 1.20  CALCIUM 8.3* 8.3* 8.2* 8.4* 8.0*  MG  --   --   --  2.2 2.2  PHOS  --   --   --  2.3* 1.8*   Liver Function Tests: Recent Labs  Lab 09/06/20 2004  AST 32  ALT 19  ALKPHOS 143*  BILITOT 1.4*  PROT 4.8*  ALBUMIN 3.0*   No results for input(s): LIPASE, AMYLASE in the last 168 hours. No results for input(s): AMMONIA in the last 168 hours. CBC: Recent Labs  Lab 09/06/20 1922 09/06/20 2004 09/06/20 2058 09/06/20 2139 09/08/20 0126 09/09/20 0136  WBC 22.5* 17.9*  --   --  13.7* 5.6  NEUTROABS  --   --   --   --   --  3.6  HGB 12.1* 11.7* 12.2* 10.9* 11.8* 11.0*  HCT 38.5* 40.7 36.0* 32.0* 33.2* 31.4*  MCV 98.5 105.2*  --   --  87.1 89.2  PLT 241 196  --   --  125* 92*   Cardiac Enzymes: No results for input(s): CKTOTAL, CKMB, CKMBINDEX, TROPONINI in the last 168 hours. BNP: Invalid input(s): POCBNP CBG: Recent Labs  Lab 09/08/20 2029 09/09/20 0012 09/09/20 0423 09/09/20 0736 09/09/20 1218  GLUCAP 191* 211* 153* 114* 165*   D-Dimer No results for input(s): DDIMER in the last 72 hours. Hgb A1c Recent Labs    09/06/20 2004  HGBA1C 8.7*   Lipid Profile No results for input(s): CHOL, HDL, LDLCALC, TRIG, CHOLHDL, LDLDIRECT in the last 72 hours. Thyroid function studies No  results for input(s): TSH, T4TOTAL, T3FREE, THYROIDAB in the last 72 hours.  Invalid input(s): FREET3 Anemia work up No results for input(s): VITAMINB12, FOLATE, FERRITIN, TIBC, IRON,  RETICCTPCT in the last 72 hours. Urinalysis    Component Value Date/Time   COLORURINE YELLOW 09/07/2020 0520   APPEARANCEUR HAZY (A) 09/07/2020 0520   LABSPEC 1.020 09/07/2020 0520   PHURINE 5.0 09/07/2020 0520   GLUCOSEU >=500 (A) 09/07/2020 0520   HGBUR SMALL (A) 09/07/2020 0520   BILIRUBINUR NEGATIVE 09/07/2020 0520   KETONESUR 5 (A) 09/07/2020 0520   PROTEINUR NEGATIVE 09/07/2020 0520   NITRITE NEGATIVE 09/07/2020 0520   LEUKOCYTESUR NEGATIVE 09/07/2020 0520   Sepsis Labs Invalid input(s): PROCALCITONIN,  WBC,  LACTICIDVEN Microbiology Recent Results (from the past 240 hour(s))  SARS CORONAVIRUS 2 (TAT 6-24 HRS) Nasopharyngeal Nasopharyngeal Swab     Status: Abnormal   Collection Time: 09/06/20  9:45 PM   Specimen: Nasopharyngeal Swab  Result Value Ref Range Status   SARS Coronavirus 2 POSITIVE (A) NEGATIVE Final    Comment: (NOTE) SARS-CoV-2 target nucleic acids are DETECTED.  The SARS-CoV-2 RNA is generally detectable in upper and lower respiratory specimens during the acute phase of infection. Positive results are indicative of the presence of SARS-CoV-2 RNA. Clinical correlation with patient history and other diagnostic information is  necessary to determine patient infection status. Positive results do not rule out bacterial infection or co-infection with other viruses.  The expected result is Negative.  Fact Sheet for Patients: HairSlick.no  Fact Sheet for Healthcare Providers: quierodirigir.com  This test is not yet approved or cleared by the [place] FDA and  has been authorized for detection and/or diagnosis of SARS-CoV-2 by FDA under an Emergency Use Authorization (EUA). This EUA will remain  in effect (meaning this test can be used) for the duration of the COVID-19 declaration under Section 564(b)(1) of the Act, 21 U. S.C. section 360bbb-3(b)(1), unless the authorization is terminated or revoked  sooner.   Performed at [facility], [address] [place], [place] [address]   Culture, blood (routine x 2)     Status: None (Preliminary result)   Collection Time: 09/06/20  9:51 PM   Specimen: BLOOD  Result Value Ref Range Status   Specimen Description BLOOD LEFT UPPER ARM  Final   Special Requests   Final    BOTTLES DRAWN AEROBIC AND ANAEROBIC Blood Culture adequate volume   Culture   Final    NO GROWTH 3 DAYS Performed at [facility], [address], [place], [place] [address]    Report Status PENDING  Incomplete  Culture, blood (routine x 2)     Status: None (Preliminary result)   Collection Time: 09/06/20 10:15 PM   Specimen: BLOOD RIGHT HAND  Result Value Ref Range Status   Specimen Description BLOOD RIGHT HAND  Final   Special Requests   Final    BOTTLES DRAWN AEROBIC AND ANAEROBIC Blood Culture results may not be optimal due to an inadequate volume of blood received in culture bottles   Culture   Final    NO GROWTH 3 DAYS Performed at [facility], [address] [place], [place] [address]    Report Status PENDING  Incomplete  MRSA PCR Screening     Status: None   Collection Time: 09/06/20 11:49 PM   Specimen: Nasal Mucosa; Nasopharyngeal  Result Value Ref Range Status   MRSA by PCR NEGATIVE NEGATIVE Final    Comment:  The GeneXpert MRSA Assay (FDA approved for NASAL specimens only), is one component of a comprehensive MRSA colonization surveillance program. It is not intended to diagnose MRSA infection nor to guide or monitor treatment for MRSA infections. Performed at [facility], [address] [place], [place] [address]      Time coordinating discharge:  40 minutes  SIGNED:   Dorcas Carrow, MD  [facility] 09/09/2020, 12:53 PM

## 2020-09-09 NOTE — Plan of Care (Signed)

## 2020-09-09 NOTE — TOC Initial Note (Addendum)
Transition of Care [facility]) - Initial/Assessment Note    Patient Details  Name: Cory Nash MRN: [id number] Date of Birth: 05/01/1960  Transition of Care [facility]) CM/SW Contact:    Lockie Pares, RN Phone Number: 09/09/2020, 7:42 AM  Clinical Narrative:                 Patient initially admitted with encephalopathy respiratory failure requiring intubation. Patient now extubated and on stepdown unit. + COVID. Patient has primary care MD may need oxygen at home, qualifications pending. Electrolytes improving, glucose was extremely high, on admit needs diabetic education. CM will follow for needs such as any DME or Home Health.   1130 patient being DC on no oxygen no needs identified  Expected Discharge Plan: Home w Home Health Services Barriers to Discharge: Continued Medical Work up   Patient Goals and CMS Choice        Expected Discharge Plan and Services Expected Discharge Plan: Home w Home Health Services   Discharge Planning Services: CM Consult   Living arrangements for the past 2 months: Single Family Home                                      Prior Living Arrangements/Services Living arrangements for the past 2 months: Single Family Home Lives with:: Spouse Patient language and need for interpreter reviewed:: Yes        Need for Family Participation in Patient Care: Yes (Comment) Care giver support system in place?: Yes (comment)   Criminal Activity/Legal Involvement Pertinent to Current Situation/Hospitalization: No - Comment as needed  Activities of Daily Living Home Assistive Devices/Equipment: Insulin Pump ADL Screening (condition at time of admission) Patient's cognitive ability adequate to safely complete daily activities?: Yes Is the patient deaf or have difficulty hearing?: No Does the patient have difficulty seeing, even when wearing glasses/contacts?: No Does the patient have difficulty concentrating, remembering, or making decisions?:  No Patient able to express need for assistance with ADLs?: Yes Does the patient have difficulty dressing or bathing?: No Independently performs ADLs?: Yes (appropriate for developmental age) Does the patient have difficulty walking or climbing stairs?: No Weakness of Legs: None Weakness of Arms/Hands: None  Permission Sought/Granted                  Emotional Assessment         Alcohol / Substance Use: Not Applicable Psych Involvement: No (comment)  Admission diagnosis:  DKA (diabetic ketoacidosis) (HCC) [E11.10] Patient Active Problem List   Diagnosis Date Noted  . DKA (diabetic ketoacidosis) (HCC) 09/06/2020  . IDDM 11/19/2009  . SMOKELESS TOBACCO ABUSE 11/19/2009  . POLYNEUROPATHY 11/19/2009  . 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 RENAL DISEASE 11/19/2009   PCP:  Hadley Pen, MD Pharmacy:   [facility] [place], [place] - [address] [place] [address] Phone: [phone] Fax: [phone]     Social Determinants of Health (SDOH) Interventions    Readmission Risk Interventions No flowsheet data found.

## 2020-09-11 LAB — CULTURE, BLOOD (ROUTINE X 2)
Culture: NO GROWTH
Culture: NO GROWTH
Special Requests: ADEQUATE

## 2021-10-08 IMAGING — DX DG ABD PORTABLE 1V
1 series · 1 of 1 positions shown · non-contrast
Comparison: None.

CLINICAL DATA: Orogastric tube placed

EXAM:
PORTABLE ABDOMEN - 1 VIEW

[abdomen kub]
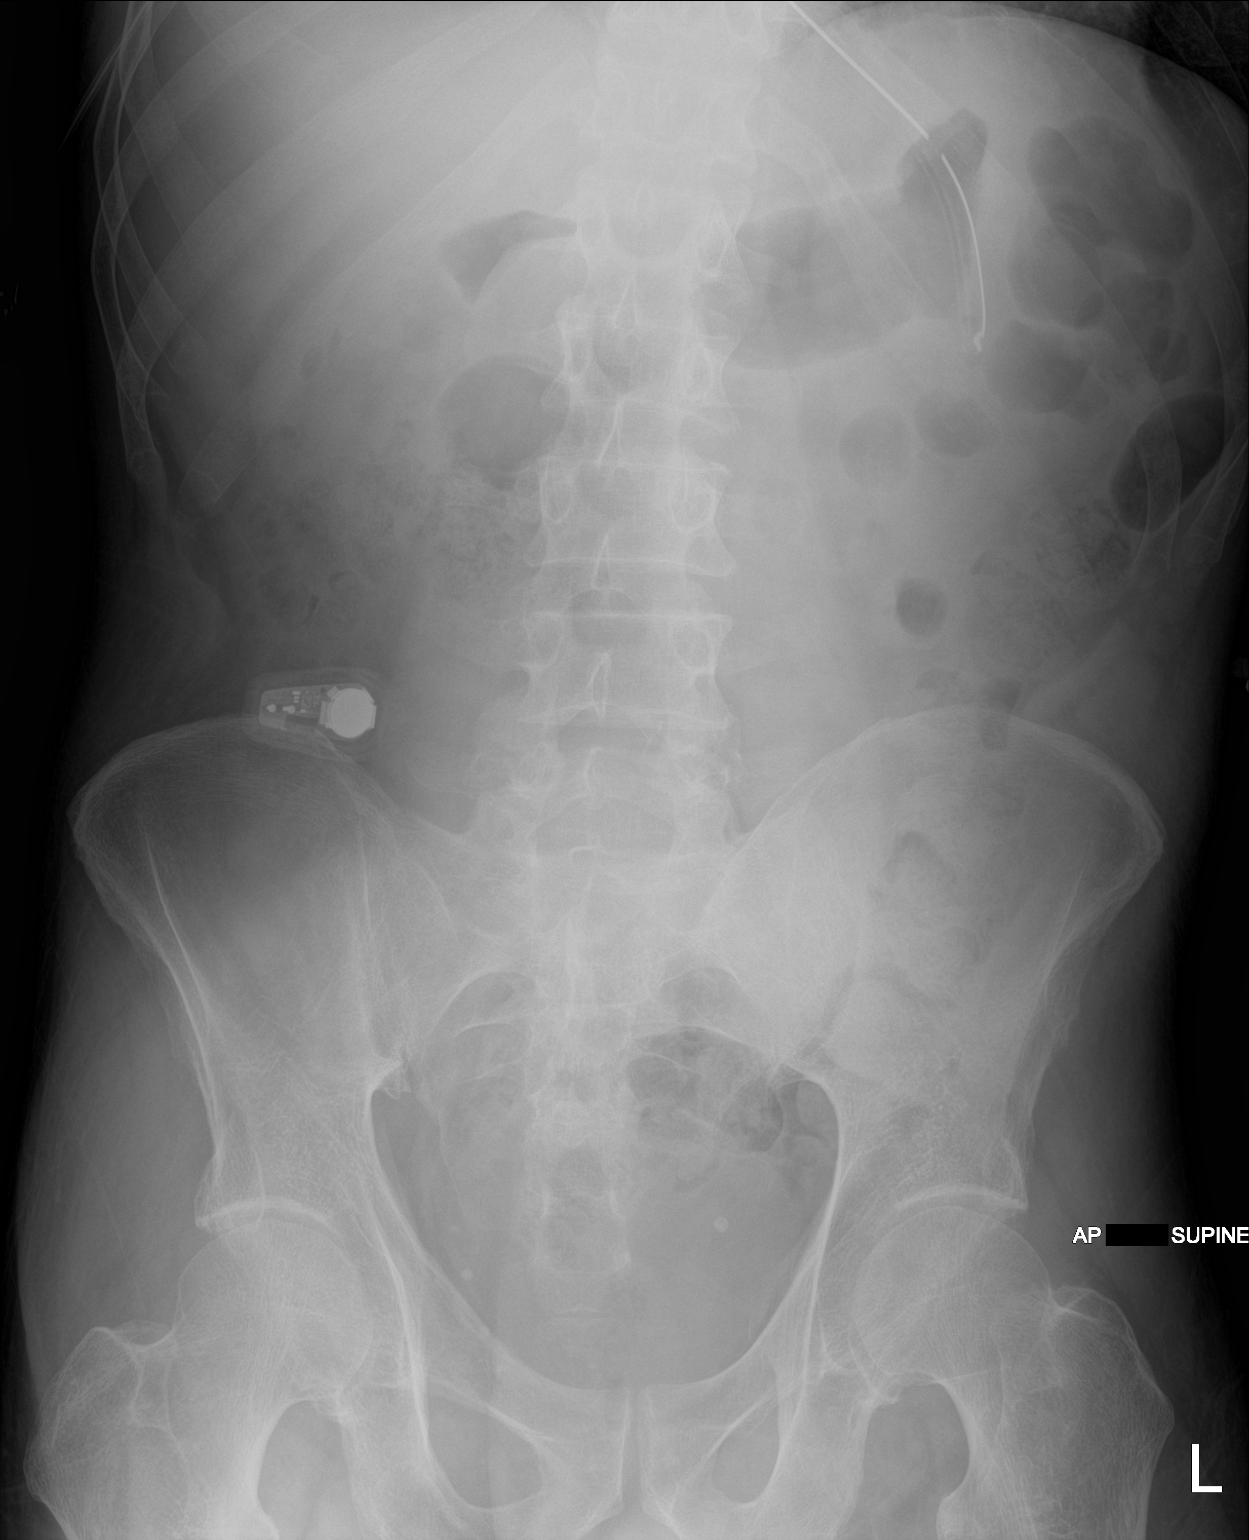

[1 of 1 positions shown; findings below may reference images not displayed]

FINDINGS: Nasogastric tube tip noted overlying the a mid body of the stomach.
Normal abdominal gas pattern. No gross free intraperitoneal gas.
IMPRESSION: Nasogastric tube tip within the mid body of the stomach.
